# Patient Record
Sex: Female | Born: 1985 | Race: White | Hispanic: No | Marital: Married | Smoking: Never smoker
Health system: Southern US, Community
[De-identification: ages and names within clinical notes are randomized; demographics above are authoritative.]

## PROBLEM LIST (undated history)

## (undated) ENCOUNTER — Inpatient Hospital Stay (HOSPITAL_COMMUNITY): Payer: BC Managed Care – PPO

## (undated) ENCOUNTER — Inpatient Hospital Stay (HOSPITAL_COMMUNITY): Payer: Self-pay

## (undated) DIAGNOSIS — Z789 Other specified health status: Secondary | ICD-10-CM

## (undated) HISTORY — PX: OTHER SURGICAL HISTORY: SHX169

## (undated) HISTORY — PX: WISDOM TOOTH EXTRACTION: SHX21

## (undated) HISTORY — DX: Other specified health status: Z78.9

---

## 2011-10-30 ENCOUNTER — Ambulatory Visit (INDEPENDENT_AMBULATORY_CARE_PROVIDER_SITE_OTHER): Payer: BC Managed Care – PPO

## 2011-10-30 DIAGNOSIS — R300 Dysuria: Secondary | ICD-10-CM

## 2013-04-15 ENCOUNTER — Encounter: Payer: Self-pay | Admitting: Obstetrics

## 2013-06-12 ENCOUNTER — Ambulatory Visit: Payer: BC Managed Care – PPO | Admitting: Obstetrics

## 2013-06-26 ENCOUNTER — Encounter: Payer: Self-pay | Admitting: Obstetrics

## 2013-06-26 ENCOUNTER — Ambulatory Visit (INDEPENDENT_AMBULATORY_CARE_PROVIDER_SITE_OTHER): Payer: BC Managed Care – PPO | Admitting: Obstetrics

## 2013-06-26 VITALS — BP 122/81 | HR 87 | Temp 98.6°F | Ht 63.0 in | Wt 188.2 lb

## 2013-06-26 DIAGNOSIS — Z1239 Encounter for other screening for malignant neoplasm of breast: Secondary | ICD-10-CM

## 2013-06-26 DIAGNOSIS — Z01419 Encounter for gynecological examination (general) (routine) without abnormal findings: Secondary | ICD-10-CM

## 2013-06-26 MED ORDER — PRENATAL PLUS 27-1 MG PO TABS
1.0000 | ORAL_TABLET | Freq: Every day | ORAL | Status: DC
Start: 2013-06-26 — End: 2014-07-14

## 2013-06-26 MED ORDER — PRENATAL PLUS 27-1 MG PO TABS: 1.0000 | ORAL_TABLET | Freq: Every day | ORAL | Status: DC

## 2013-06-26 NOTE — Progress Notes (Signed)
.   Subjective:     Katelyn Marks is a 27 y.o. female here for a routine exam.  No current complaints.  Personal health questionnaire reviewed: yes.   Gynecologic History Patient's last menstrual period was 06/08/2013. Contraception: OCP (estrogen/progesterone) Last Pap: 2012. Results were: normal Last mammogram: N/A  Obstetric History OB History  No data available     The following portions of the patient's history were reviewed and updated as appropriate: allergies, current medications, past family history, past medical history, past social history, past surgical history and problem list.  Review of Systems Pertinent items are noted in HPI.    Objective:    General appearance: alert and no distress Breasts: normal appearance, no masses or tenderness Abdomen: normal findings: soft, non-tender Pelvic: cervix normal in appearance, external genitalia normal, no adnexal masses or tenderness, no cervical motion tenderness, uterus normal size, shape, and consistency and vagina normal without discharge    Assessment:    Healthy female exam.    Plan:    Contraception: OCP (estrogen/progesterone). Follow up in: 1 year.

## 2013-06-27 LAB — PAP IG W/ RFLX HPV ASCU

## 2013-06-27 LAB — WET PREP BY MOLECULAR PROBE: Gardnerella vaginalis: NEGATIVE

## 2014-05-07 ENCOUNTER — Ambulatory Visit (INDEPENDENT_AMBULATORY_CARE_PROVIDER_SITE_OTHER): Payer: BC Managed Care – PPO | Admitting: *Deleted

## 2014-05-07 VITALS — BP 128/81 | HR 98 | Temp 97.7°F | Ht 63.0 in | Wt 193.0 lb

## 2014-05-07 DIAGNOSIS — Z3200 Encounter for pregnancy test, result unknown: Secondary | ICD-10-CM

## 2014-05-07 LAB — POCT URINE PREGNANCY: Preg Test, Ur: POSITIVE

## 2014-05-07 NOTE — Progress Notes (Signed)
Patient in office today for a pregnancy test. Patient states she had two positive home pregnancy tests.  Pregnancy test in office is positive. EDD: 01-06-15. Patient intends on continuing pregnancy.  Patient is currently taking prenatal vitamins and encouraged to continue.  NOB appointment set up for 10 weeks.  Safe OTC meds for nausea and vomiting reviewed.

## 2014-06-10 ENCOUNTER — Other Ambulatory Visit: Payer: Self-pay | Admitting: Obstetrics

## 2014-06-10 ENCOUNTER — Encounter: Payer: Self-pay | Admitting: Obstetrics

## 2014-06-10 ENCOUNTER — Ambulatory Visit (INDEPENDENT_AMBULATORY_CARE_PROVIDER_SITE_OTHER): Payer: BC Managed Care – PPO | Admitting: Obstetrics

## 2014-06-10 VITALS — BP 138/93 | HR 96 | Temp 96.5°F | Wt 188.0 lb

## 2014-06-10 DIAGNOSIS — Z3400 Encounter for supervision of normal first pregnancy, unspecified trimester: Secondary | ICD-10-CM

## 2014-06-10 DIAGNOSIS — Z3401 Encounter for supervision of normal first pregnancy, first trimester: Secondary | ICD-10-CM

## 2014-06-10 LAB — POCT URINALYSIS DIPSTICK
Bilirubin, UA: NEGATIVE
GLUCOSE UA: NEGATIVE
Ketones, UA: NEGATIVE
LEUKOCYTES UA: NEGATIVE
Nitrite, UA: NEGATIVE
PH UA: 7
Protein, UA: NEGATIVE
RBC UA: NEGATIVE
Spec Grav, UA: <=1.005
UROBILINOGEN UA: NEGATIVE

## 2014-06-10 LAB — OB RESULTS CONSOLE GC/CHLAMYDIA
Chlamydia: NEGATIVE
Gonorrhea: NEGATIVE

## 2014-06-11 ENCOUNTER — Encounter: Payer: Self-pay | Admitting: Obstetrics

## 2014-06-11 LAB — OBSTETRIC PANEL
ANTIBODY SCREEN: NEGATIVE
Basophils Absolute: 0 10*3/uL (ref 0.0–0.1)
Basophils Relative: 0 % (ref 0–1)
EOS PCT: 1 % (ref 0–5)
Eosinophils Absolute: 0.1 10*3/uL (ref 0.0–0.7)
HEMATOCRIT: 44.8 % (ref 36.0–46.0)
Hemoglobin: 15.1 g/dL — ABNORMAL HIGH (ref 12.0–15.0)
Hepatitis B Surface Ag: NEGATIVE
LYMPHS ABS: 2 10*3/uL (ref 0.7–4.0)
LYMPHS PCT: 19 % (ref 12–46)
MCH: 28.9 pg (ref 26.0–34.0)
MCHC: 33.7 g/dL (ref 30.0–36.0)
MCV: 85.8 fL (ref 78.0–100.0)
MONO ABS: 0.6 10*3/uL (ref 0.1–1.0)
MONOS PCT: 6 % (ref 3–12)
Neutro Abs: 7.7 10*3/uL (ref 1.7–7.7)
Neutrophils Relative %: 74 % (ref 43–77)
Platelets: 338 10*3/uL (ref 150–400)
RBC: 5.22 MIL/uL — AB (ref 3.87–5.11)
RDW: 13.9 % (ref 11.5–15.5)
RH TYPE: POSITIVE
RUBELLA: 3.82 {index} — AB (ref <0.90)
WBC: 10.4 10*3/uL (ref 4.0–10.5)

## 2014-06-11 LAB — VARICELLA ZOSTER ANTIBODY, IGG: Varicella IgG: 1971 Index — ABNORMAL HIGH (ref <135.00)

## 2014-06-11 LAB — CULTURE, OB URINE

## 2014-06-11 LAB — GC/CHLAMYDIA PROBE AMP
CT Probe RNA: NEGATIVE
GC PROBE AMP APTIMA: NEGATIVE

## 2014-06-11 LAB — WET PREP BY MOLECULAR PROBE
Candida species: NEGATIVE
Gardnerella vaginalis: NEGATIVE
TRICHOMONAS VAG: NEGATIVE

## 2014-06-11 LAB — TSH: TSH: 0.626 u[IU]/mL (ref 0.350–4.500)

## 2014-06-11 LAB — VITAMIN D 25 HYDROXY (VIT D DEFICIENCY, FRACTURES): VIT D 25 HYDROXY: 59 ng/mL (ref 30–89)

## 2014-06-11 LAB — HIV ANTIBODY (ROUTINE TESTING W REFLEX): HIV: NONREACTIVE

## 2014-06-11 NOTE — Progress Notes (Signed)
  Subjective:    Katelyn Marks is a 28 y.o. female being seen today for her obstetrical visit. She is at [redacted]w[redacted]d gestation. Patient reports: no complaints.  Problem List Items Addressed This Visit   None    Visit Diagnoses   Encounter for supervision of normal first pregnancy in first trimester    -  Primary    Relevant Orders       Obstetric panel (Completed)       HIV antibody (Completed)       Varicella zoster antibody, IgG (Completed)       Vit D  25 hydroxy (rtn osteoporosis monitoring) (Completed)       Culture, OB Urine       GC/Chlamydia Probe Amp (Completed)       WET PREP BY MOLECULAR PROBE       TSH (Completed)      There are no active problems to display for this patient.   Objective:     BP 138/93  Pulse 96  Temp(Src) 96.5 F (35.8 C)  Wt 188 lb (85.276 kg)  LMP 04/01/2014 Uterine Size: Below umbilicus     Assessment:    Pregnancy @ [redacted]w[redacted]d  weeks Doing well    Plan:    Problem list reviewed and updated. Labs reviewed.  Follow up in 4 weeks. FIRST/CF mutation testing/NIPT/QUAD SCREEN/fragile X/[name] population testing/Spinal muscular atrophy discussed: requested. Role of ultrasound in pregnancy discussed; fetal survey: requested. Amniocentesis discussed: not indicated.

## 2014-06-12 NOTE — Addendum Note (Signed)
Addended by: Odessa Fleming on: 06/12/2014 11:07 AM   Modules accepted: Orders

## 2014-07-09 ENCOUNTER — Ambulatory Visit (INDEPENDENT_AMBULATORY_CARE_PROVIDER_SITE_OTHER): Payer: BC Managed Care – PPO | Admitting: Obstetrics

## 2014-07-09 ENCOUNTER — Encounter: Payer: Self-pay | Admitting: Obstetrics

## 2014-07-09 VITALS — BP 129/84 | HR 82 | Temp 98.2°F | Wt 183.0 lb

## 2014-07-09 DIAGNOSIS — Z3402 Encounter for supervision of normal first pregnancy, second trimester: Secondary | ICD-10-CM

## 2014-07-09 DIAGNOSIS — O219 Vomiting of pregnancy, unspecified: Secondary | ICD-10-CM

## 2014-07-09 LAB — POCT URINALYSIS DIPSTICK
Bilirubin, UA: NEGATIVE
Blood, UA: NEGATIVE
Glucose, UA: NEGATIVE
Ketones, UA: NEGATIVE
Leukocytes, UA: NEGATIVE
NITRITE UA: NEGATIVE
PH UA: 6
PROTEIN UA: NEGATIVE
UROBILINOGEN UA: NEGATIVE

## 2014-07-09 MED ORDER — DOXYLAMINE-PYRIDOXINE 10-10 MG PO TBEC: 1.0000 | DELAYED_RELEASE_TABLET | Freq: Every day | ORAL | Status: DC

## 2014-07-09 NOTE — Progress Notes (Signed)
  Subjective:    Katelyn Marks is a 28 y.o. female being seen today for her obstetrical visit. She is at 4544w1d gestation. Patient reports: nausea and vomiting.  Problem List Items Addressed This Visit   Nausea and vomiting during pregnancy prior to [redacted] weeks gestation   Relevant Medications      Doxylamine-Pyridoxine 10-10 MG TBEC    Other Visit Diagnoses   Encounter for supervision of normal first pregnancy in second trimester    -  Primary    Relevant Orders       POCT urinalysis dipstick (Completed)      Patient Active Problem List   Diagnosis Date Noted  . Nausea and vomiting during pregnancy prior to [redacted] weeks gestation 07/09/2014    Objective:     BP 129/84  Pulse 82  Temp(Src) 98.2 F (36.8 C)  Wt 183 lb (83.008 kg)  LMP 04/01/2014 Uterine Size: Below umbilicus     Assessment:    Pregnancy @ 3244w1d  weeks  Nausea and Vomiting  Plan:   Stop PNV's Diclegis Rx Dietary instructions given for prevention of nausea.   Problem list reviewed and updated. Labs reviewed.  Follow up in 2 weeks. FIRST/CF mutation testing/NIPT/QUAD SCREEN/fragile X/[name] population testing/Spinal muscular atrophy discussed: requested. Role of ultrasound in pregnancy discussed; fetal survey: requested. Amniocentesis discussed: not indicated.

## 2014-07-14 ENCOUNTER — Other Ambulatory Visit: Payer: Self-pay | Admitting: Obstetrics

## 2014-07-27 ENCOUNTER — Encounter: Payer: Self-pay | Admitting: Obstetrics

## 2014-07-27 ENCOUNTER — Ambulatory Visit (INDEPENDENT_AMBULATORY_CARE_PROVIDER_SITE_OTHER): Payer: BC Managed Care – PPO | Admitting: Obstetrics

## 2014-07-27 VITALS — BP 127/82 | HR 82 | Wt 184.0 lb

## 2014-07-27 DIAGNOSIS — Z3402 Encounter for supervision of normal first pregnancy, second trimester: Secondary | ICD-10-CM

## 2014-07-27 LAB — POCT URINALYSIS DIPSTICK
Bilirubin, UA: NEGATIVE
Glucose, UA: NEGATIVE
KETONES UA: NEGATIVE
Leukocytes, UA: NEGATIVE
Nitrite, UA: NEGATIVE
PH UA: 6
PROTEIN UA: NEGATIVE
RBC UA: NEGATIVE
SPEC GRAV UA: 1.01
Urobilinogen, UA: NEGATIVE

## 2014-07-27 NOTE — Progress Notes (Signed)
  Subjective:    Katelyn Marks is a 28 y.o. female being seen today for her obstetrical visit. She is at 29103w5d gestation. Patient reports: no complaints.  Problem List Items Addressed This Visit   None    Visit Diagnoses   Encounter for supervision of normal first pregnancy in second trimester    -  Primary    Relevant Orders       POCT urinalysis dipstick (Completed)       US OB Comp + 14 Wk       AFP, Quad Screen      Patient Active Problem List   Diagnosis Date Noted  . Nausea and vomiting during pregnancy prior to [redacted] weeks gestation 07/09/2014    Objective:     BP 127/82  Pulse 82  Wt 184 lb (83.462 kg)  LMP 04/01/2014 Uterine Size: Below umbilicus     Assessment:    Pregnancy @ 52103w5d  weeks Doing well    Plan:    Problem list reviewed and updated. Labs reviewed.  Follow up in 4 weeks. FIRST/CF mutation testing/NIPT/QUAD SCREEN/fragile X/[name] population testing/Spinal muscular atrophy discussed: requested. Role of ultrasound in pregnancy discussed; fetal survey: requested. Amniocentesis discussed: not indicated.

## 2014-07-28 ENCOUNTER — Telehealth: Payer: Self-pay

## 2014-07-28 LAB — AFP, QUAD SCREEN
AFP: 18.3 ng/mL
Age Alone: 1:970 {titer}
Curr Gest Age: 16.5 wks.days
Down Syndrome Scr Risk Est: 1:285 {titer}
HCG, Total: 44.71 [IU]/mL
INH: 251.6 pg/mL
Interpretation-AFP: NEGATIVE
MoM for AFP: 0.57
MoM for INH: 1.77
MoM for hCG: 1.49
Open Spina bifida: NEGATIVE
Osb Risk: <1:27300 {titer}
Tri 18 Scr Risk Est: NEGATIVE
Trisomy 18 (Edward) Syndrome Interp.: <1:25400 {titer}
uE3 Mom: 1.85
uE3 Value: 1.66 ng/mL

## 2014-07-28 NOTE — Telephone Encounter (Signed)
left message regarding appt with [facility] for u/s on 10/22, 3:30pm

## 2014-07-28 NOTE — Telephone Encounter (Signed)
FMLA paperwork faxed - 15.00 pmt when she comes for next appt.

## 2014-07-30 ENCOUNTER — Ambulatory Visit (HOSPITAL_COMMUNITY): Payer: BC Managed Care – PPO

## 2014-08-03 ENCOUNTER — Ambulatory Visit (HOSPITAL_COMMUNITY)
Admission: RE | Admit: 2014-08-03 | Discharge: 2014-08-03 | Disposition: A | Discharge status: Home / Self Care | Payer: BC Managed Care – PPO | Source: Ambulatory Visit | Attending: Obstetrics | Admitting: Obstetrics

## 2014-08-03 DIAGNOSIS — Z3689 Encounter for other specified antenatal screening: Secondary | ICD-10-CM

## 2014-08-03 DIAGNOSIS — Z3A17 17 weeks gestation of pregnancy: Secondary | ICD-10-CM | POA: Insufficient documentation

## 2014-08-03 DIAGNOSIS — Z36 Encounter for antenatal screening of mother: Secondary | ICD-10-CM | POA: Diagnosis present

## 2014-08-03 DIAGNOSIS — Z3402 Encounter for supervision of normal first pregnancy, second trimester: Secondary | ICD-10-CM

## 2014-08-10 ENCOUNTER — Encounter: Payer: Self-pay | Admitting: Obstetrics

## 2014-08-26 ENCOUNTER — Encounter: Payer: BC Managed Care – PPO | Admitting: Obstetrics

## 2014-08-28 ENCOUNTER — Ambulatory Visit (INDEPENDENT_AMBULATORY_CARE_PROVIDER_SITE_OTHER): Payer: BC Managed Care – PPO | Admitting: Obstetrics

## 2014-08-28 ENCOUNTER — Encounter: Payer: Self-pay | Admitting: Obstetrics

## 2014-08-28 VITALS — BP 121/80 | HR 100 | Temp 97.1°F | Wt 192.0 lb

## 2014-08-28 DIAGNOSIS — Z3402 Encounter for supervision of normal first pregnancy, second trimester: Secondary | ICD-10-CM

## 2014-08-28 LAB — POCT URINALYSIS DIPSTICK
Bilirubin, UA: NEGATIVE
Blood, UA: NEGATIVE
Glucose, UA: NEGATIVE
Ketones, UA: NEGATIVE
LEUKOCYTES UA: NEGATIVE
NITRITE UA: NEGATIVE
PROTEIN UA: NEGATIVE
Spec Grav, UA: 1.01
UROBILINOGEN UA: NEGATIVE
pH, UA: 6.5

## 2014-08-28 MED ORDER — PREPLUS 27-1 MG PO TABS: ORAL_TABLET | ORAL | Status: DC

## 2014-08-28 NOTE — Progress Notes (Signed)
Subjective:    Katelyn Marks is a 28 y.o. female being seen today for her obstetrical visit. She is at 428w2d gestation. Patient reports: no complaints . Fetal movement: normal.  Problem List Items Addressed This Visit    None    Visit Diagnoses    Encounter for supervision of normal first pregnancy in second trimester    -  Primary    Relevant Orders       POCT urinalysis dipstick (Completed)      Patient Active Problem List   Diagnosis Date Noted  . Nausea and vomiting during pregnancy prior to [redacted] weeks gestation 07/09/2014   Objective:    BP 121/80 mmHg  Pulse 100  Temp(Src) 97.1 F (36.2 C)  Wt 192 lb (87.091 kg)  LMP 04/01/2014 FHT: 150 BPM  Uterine Size: size equals dates     Assessment:    Pregnancy @ 3028w2d    Plan:    OBGCT: ordered for next visit.  Labs, problem list reviewed and updated 2 hr GTT planned Follow up in 4 weeks.

## 2014-09-29 ENCOUNTER — Ambulatory Visit (INDEPENDENT_AMBULATORY_CARE_PROVIDER_SITE_OTHER): Payer: BC Managed Care – PPO | Admitting: Obstetrics

## 2014-09-29 ENCOUNTER — Encounter: Payer: Self-pay | Admitting: Obstetrics

## 2014-09-29 ENCOUNTER — Other Ambulatory Visit: Payer: BC Managed Care – PPO

## 2014-09-29 VITALS — BP 119/82 | HR 102 | Temp 97.7°F | Wt 190.0 lb

## 2014-09-29 DIAGNOSIS — Z3402 Encounter for supervision of normal first pregnancy, second trimester: Secondary | ICD-10-CM

## 2014-09-29 LAB — POCT URINALYSIS DIPSTICK
Bilirubin, UA: NEGATIVE
Blood, UA: NEGATIVE
Glucose, UA: NEGATIVE
Leukocytes, UA: NEGATIVE
NITRITE UA: NEGATIVE
PH UA: 6
PROTEIN UA: NEGATIVE
Spec Grav, UA: 1.015
Urobilinogen, UA: NEGATIVE

## 2014-09-29 NOTE — Progress Notes (Signed)
Subjective:    Katelyn Marks is a 28 y.o. female being seen today for her obstetrical visit. She is at 3254w6d gestation. Patient reports: no complaints . Fetal movement: normal.  Problem List Items Addressed This Visit    None    Visit Diagnoses    Encounter for supervision of normal first pregnancy in second trimester    -  Primary    Relevant Orders       POCT urinalysis dipstick (Completed)      Patient Active Problem List   Diagnosis Date Noted  . Nausea and vomiting during pregnancy prior to [redacted] weeks gestation 07/09/2014   Objective:    BP 119/82 mmHg  Pulse 102  Temp(Src) 97.7 F (36.5 C)  Wt 190 lb (86.183 kg)  LMP 04/01/2014 FHT: 150 BPM  Uterine Size: size equals dates     Assessment:    Pregnancy @ 1054w6d    Plan:    OBGCT: discussed.  Labs, problem list reviewed and updated 2 hr GTT planned Follow up in 2 weeks.

## 2014-09-29 NOTE — Addendum Note (Signed)
Addended by: Odessa FlemingBOHNE, Denissa Cozart M on: 09/29/2014 02:04 PM   Modules accepted: Orders

## 2014-09-30 LAB — GLUCOSE TOLERANCE, 2 HOURS W/ 1HR
Glucose, 1 hour: 150 mg/dL (ref 70–170)
Glucose, 2 hour: 140 mg/dL — ABNORMAL HIGH (ref 70–139)
Glucose, Fasting: 72 mg/dL (ref 70–99)

## 2014-09-30 LAB — CBC
HEMATOCRIT: 37.5 % (ref 36.0–46.0)
Hemoglobin: 12.4 g/dL (ref 12.0–15.0)
MCH: 29.4 pg (ref 26.0–34.0)
MCHC: 33.1 g/dL (ref 30.0–36.0)
MCV: 88.9 fL (ref 78.0–100.0)
MPV: 9.7 fL (ref 9.4–12.4)
Platelets: 243 10*3/uL (ref 150–400)
RBC: 4.22 MIL/uL (ref 3.87–5.11)
RDW: 14.2 % (ref 11.5–15.5)
WBC: 13.2 10*3/uL — ABNORMAL HIGH (ref 4.0–10.5)

## 2014-09-30 LAB — RPR

## 2014-09-30 LAB — HIV ANTIBODY (ROUTINE TESTING W REFLEX): HIV 1&2 Ab, 4th Generation: NONREACTIVE

## 2014-10-13 ENCOUNTER — Ambulatory Visit (INDEPENDENT_AMBULATORY_CARE_PROVIDER_SITE_OTHER): Payer: BC Managed Care – PPO | Admitting: Obstetrics

## 2014-10-13 VITALS — BP 116/78 | HR 99 | Temp 98.2°F | Wt 191.0 lb

## 2014-10-13 DIAGNOSIS — Z3403 Encounter for supervision of normal first pregnancy, third trimester: Secondary | ICD-10-CM

## 2014-10-13 LAB — POCT URINALYSIS DIPSTICK
Bilirubin, UA: NEGATIVE
Blood, UA: NEGATIVE
GLUCOSE UA: NEGATIVE
Ketones, UA: NEGATIVE
Leukocytes, UA: NEGATIVE
Nitrite, UA: NEGATIVE
Protein, UA: NEGATIVE
Spec Grav, UA: 1.015
Urobilinogen, UA: NEGATIVE
pH, UA: 6.5

## 2014-10-14 ENCOUNTER — Encounter: Payer: Self-pay | Admitting: Obstetrics

## 2014-10-14 NOTE — Progress Notes (Signed)
Subjective:    Katelyn Marks is a 29 y.o. female being seen today for her obstetrical visit. She is at 5266w0d gestation. Patient reports no complaints. Fetal movement: normal.  Problem List Items Addressed This Visit    None    Visit Diagnoses    Encounter for supervision of normal first pregnancy in second trimester    -  Primary    Relevant Orders       POCT urinalysis dipstick (Completed)      Patient Active Problem List   Diagnosis Date Noted  . Nausea and vomiting during pregnancy prior to [redacted] weeks gestation 07/09/2014   Objective:    BP 116/78 mmHg  Pulse 99  Temp(Src) 98.2 F (36.8 C)  Wt 191 lb (86.637 kg)  LMP 04/01/2014 FHT:  150 BPM  Uterine Size: size equals dates  Presentation: unsure     Assessment:    Pregnancy @ 6266w0d weeks   Plan:     labs reviewed, problem list updated Consent signed. GBS sent TDAP offered  Rhogam given for RH negative Pediatrician: discussed. Infant feeding: plans to breastfeed. Maternity leave: not discussed. Cigarette smoking: none Orders Placed This Encounter  Procedures  . POCT urinalysis dipstick   No orders of the defined types were placed in this encounter.   Follow up in 2 Weeks.

## 2014-10-27 ENCOUNTER — Ambulatory Visit (INDEPENDENT_AMBULATORY_CARE_PROVIDER_SITE_OTHER): Payer: BC Managed Care – PPO | Admitting: Obstetrics

## 2014-10-27 VITALS — BP 122/83 | HR 104 | Temp 98.5°F | Wt 195.0 lb

## 2014-10-27 DIAGNOSIS — Z3403 Encounter for supervision of normal first pregnancy, third trimester: Secondary | ICD-10-CM

## 2014-10-27 LAB — POCT URINALYSIS DIPSTICK
Bilirubin, UA: NEGATIVE
Blood, UA: NEGATIVE
Glucose, UA: NEGATIVE
KETONES UA: NEGATIVE
NITRITE UA: NEGATIVE
Spec Grav, UA: <=1.005
UROBILINOGEN UA: NEGATIVE
pH, UA: 7.5

## 2014-10-28 ENCOUNTER — Encounter: Payer: Self-pay | Admitting: Obstetrics

## 2014-10-28 NOTE — Progress Notes (Signed)
Subjective:    Katelyn BeckmannKimberly A Marks is a 29 y.o. female being seen today for her obstetrical visit. She is at 2954w0d gestation. Patient reports no complaints. Fetal movement: normal.  Problem List Items Addressed This Visit    None    Visit Diagnoses    Encounter for supervision of normal first pregnancy in third trimester    -  Primary    Relevant Orders    POCT urinalysis dipstick (Completed)      Patient Active Problem List   Diagnosis Date Noted  . Nausea and vomiting during pregnancy prior to [redacted] weeks gestation 07/09/2014   Objective:    BP 122/83 mmHg  Pulse 104  Temp(Src) 98.5 F (36.9 C)  Wt 195 lb (88.451 kg)  LMP 04/01/2014 FHT:  150 BPM  Uterine Size: size equals dates  Presentation: unsure     Assessment:    Pregnancy @ 5454w0d weeks   Plan:     labs reviewed, problem list updated Consent signed. GBS sent TDAP offered  Rhogam given for RH negative Pediatrician: discussed. Infant feeding: plans to breastfeed. Maternity leave: not discussed. Cigarette smoking: never smoked. Orders Placed This Encounter  Procedures  . POCT urinalysis dipstick   No orders of the defined types were placed in this encounter.   Follow up in 2 Weeks.

## 2014-11-11 ENCOUNTER — Ambulatory Visit (INDEPENDENT_AMBULATORY_CARE_PROVIDER_SITE_OTHER): Payer: BC Managed Care – PPO | Admitting: Obstetrics

## 2014-11-11 VITALS — BP 128/83 | HR 92 | Temp 98.1°F | Wt 195.0 lb

## 2014-11-11 DIAGNOSIS — Z3403 Encounter for supervision of normal first pregnancy, third trimester: Secondary | ICD-10-CM

## 2014-11-11 LAB — POCT URINALYSIS DIPSTICK
Bilirubin, UA: NEGATIVE
Blood, UA: NEGATIVE
Glucose, UA: NEGATIVE
Ketones, UA: NEGATIVE
LEUKOCYTES UA: NEGATIVE
NITRITE UA: NEGATIVE
PH UA: 6
PROTEIN UA: NEGATIVE
Spec Grav, UA: 1.01
Urobilinogen, UA: NEGATIVE

## 2014-11-12 ENCOUNTER — Encounter: Payer: Self-pay | Admitting: Obstetrics

## 2014-11-12 NOTE — Progress Notes (Signed)
Subjective:    Katelyn Marks is a 29 y.o. female being seen today for her obstetrical visit. She is at 2865w1d gestation. Patient reports no complaints. Fetal movement: normal.  Problem List Items Addressed This Visit    None    Visit Diagnoses    Encounter for supervision of normal first pregnancy in third trimester    -  Primary    Relevant Orders    POCT urinalysis dipstick (Completed)      Patient Active Problem List   Diagnosis Date Noted  . Nausea and vomiting during pregnancy prior to [redacted] weeks gestation 07/09/2014   Objective:    BP 128/83 mmHg  Pulse 92  Temp(Src) 98.1 F (36.7 C)  Wt 195 lb (88.451 kg)  LMP 04/01/2014 FHT:  150 BPM  Uterine Size: size equals dates  Presentation: unsure     Assessment:    Pregnancy @ 9765w1d weeks   Plan:     labs reviewed, problem list updated Consent signed. GBS sent TDAP offered  Rhogam given for RH negative Pediatrician: discussed. Infant feeding: plans to breastfeed. Maternity leave: discussed. Cigarette smoking: never smoked. Orders Placed This Encounter  Procedures  . POCT urinalysis dipstick   No orders of the defined types were placed in this encounter.   Follow up in 2 Weeks.

## 2014-11-20 ENCOUNTER — Telehealth: Payer: Self-pay | Admitting: Obstetrics

## 2014-11-20 ENCOUNTER — Inpatient Hospital Stay (HOSPITAL_COMMUNITY)
Admission: AD | Admit: 2014-11-20 | Discharge: 2014-11-20 | Disposition: A | Discharge status: Home / Self Care | Payer: BC Managed Care – PPO | Source: Ambulatory Visit | Attending: Obstetrics | Admitting: Obstetrics

## 2014-11-20 ENCOUNTER — Encounter (HOSPITAL_COMMUNITY): Payer: Self-pay | Admitting: *Deleted

## 2014-11-20 DIAGNOSIS — O99613 Diseases of the digestive system complicating pregnancy, third trimester: Secondary | ICD-10-CM | POA: Diagnosis not present

## 2014-11-20 DIAGNOSIS — R12 Heartburn: Secondary | ICD-10-CM | POA: Diagnosis not present

## 2014-11-20 DIAGNOSIS — R1013 Epigastric pain: Secondary | ICD-10-CM | POA: Insufficient documentation

## 2014-11-20 DIAGNOSIS — Z3A33 33 weeks gestation of pregnancy: Secondary | ICD-10-CM | POA: Diagnosis not present

## 2014-11-20 DIAGNOSIS — K219 Gastro-esophageal reflux disease without esophagitis: Secondary | ICD-10-CM | POA: Diagnosis not present

## 2014-11-20 DIAGNOSIS — R109 Unspecified abdominal pain: Secondary | ICD-10-CM | POA: Diagnosis present

## 2014-11-20 DIAGNOSIS — O9989 Other specified diseases and conditions complicating pregnancy, childbirth and the puerperium: Secondary | ICD-10-CM | POA: Diagnosis not present

## 2014-11-20 LAB — COMPREHENSIVE METABOLIC PANEL
ALBUMIN: 2.9 g/dL — AB (ref 3.5–5.2)
ALK PHOS: 105 U/L (ref 39–117)
ALT: 13 U/L (ref 0–35)
AST: 16 U/L (ref 0–37)
Anion gap: 8 (ref 5–15)
BUN: 6 mg/dL (ref 6–23)
CO2: 21 mmol/L (ref 19–32)
Calcium: 8.7 mg/dL (ref 8.4–10.5)
Chloride: 108 mmol/L (ref 96–112)
Creatinine, Ser: 0.45 mg/dL — ABNORMAL LOW (ref 0.50–1.10)
GFR calc Af Amer: >90 mL/min (ref >90)
GFR calc non Af Amer: >90 mL/min (ref >90)
Glucose, Bld: 96 mg/dL (ref 70–99)
POTASSIUM: 3.9 mmol/L (ref 3.5–5.1)
SODIUM: 137 mmol/L (ref 135–145)
Total Bilirubin: 0.2 mg/dL — ABNORMAL LOW (ref 0.3–1.2)
Total Protein: 6.4 g/dL (ref 6.0–8.3)

## 2014-11-20 LAB — CBC WITH DIFFERENTIAL/PLATELET
Basophils Absolute: 0 10*3/uL (ref 0.0–0.1)
Basophils Relative: 0 % (ref 0–1)
EOS PCT: 1 % (ref 0–5)
Eosinophils Absolute: 0.1 10*3/uL (ref 0.0–0.7)
HCT: 36.5 % (ref 36.0–46.0)
Hemoglobin: 12.1 g/dL (ref 12.0–15.0)
Lymphocytes Relative: 12 % (ref 12–46)
Lymphs Abs: 1.2 10*3/uL (ref 0.7–4.0)
MCH: 29.2 pg (ref 26.0–34.0)
MCHC: 33.2 g/dL (ref 30.0–36.0)
MCV: 88 fL (ref 78.0–100.0)
Monocytes Absolute: 0.7 10*3/uL (ref 0.1–1.0)
Monocytes Relative: 7 % (ref 3–12)
NEUTROS ABS: 8.6 10*3/uL — AB (ref 1.7–7.7)
NEUTROS PCT: 80 % — AB (ref 43–77)
Platelets: 212 10*3/uL (ref 150–400)
RBC: 4.15 MIL/uL (ref 3.87–5.11)
RDW: 14.3 % (ref 11.5–15.5)
WBC: 10.5 10*3/uL (ref 4.0–10.5)

## 2014-11-20 LAB — LIPASE, BLOOD: Lipase: 22 U/L (ref 11–59)

## 2014-11-20 LAB — AMYLASE: Amylase: 40 U/L (ref 0–105)

## 2014-11-20 MED ORDER — FAMOTIDINE 20 MG PO TABS
20.0000 mg | ORAL_TABLET | Freq: Once | ORAL | Status: AC
  Administered 2014-11-20: 20 mg via ORAL
  Filled 2014-11-20: qty 1

## 2014-11-20 MED ORDER — FAMOTIDINE 20 MG PO TABS: 20.0000 mg | ORAL_TABLET | Freq: Two times a day (BID) | ORAL | Status: DC

## 2014-11-20 NOTE — MAU Note (Signed)
Started having pain in upper abd last night.  Last a couple hours. Felt like menstrual cramps.  Started again this morning. Also having nausea and vomiting. No diarrhea or fever.

## 2014-11-20 NOTE — Telephone Encounter (Signed)
Pt called to set up an appt with Dr. Clearance CootsHarper. Pt stated a lot of discomfort and pain on her upper abdominal area. [facility]  Pt understood recommendation.   [name]

## 2014-11-20 NOTE — MAU Note (Signed)
Urine in lab 

## 2014-11-20 NOTE — MAU Provider Note (Signed)
History     CSN: [id number]  Arrival date and time: 11/20/14 1008   First Provider Initiated Contact with Patient 11/20/14 1050      Chief Complaint  Patient presents with  . Abdominal Cramping  . Emesis   HPI  [name] is a 29 y.o. G1P0 at [redacted]w[redacted]d who presents to MAU today with complaint of epigastric pain since last night. She states an intermittent cramping pain "just below the ribcage." She has had one episode of N/V today. She denies pain now. She does have heartburn. She denies vaginal bleeding, discharge, LOF or contractions. She reports good fetal movement.   OB History    Gravida Para Term Preterm AB TAB SAB Ectopic Multiple Living   1         0      Past Medical History  Diagnosis Date  . Medical history non-contributory     Past Surgical History  Procedure Laterality Date  . Wisdome to    . Wisdom tooth extraction      Family History  Problem Relation Age of Onset  . Cancer Maternal Grandmother     ovarian  . Diabetes Maternal Grandmother   . Diabetes Paternal Grandfather     History  Substance Use Topics  . Smoking status: Never Smoker   . Smokeless tobacco: Never Used  . Alcohol Use: No    Allergies:  Allergies  Allergen Reactions  . Ciprofloxacin Nausea And Vomiting    No prescriptions prior to admission    Review of Systems  Constitutional: Negative for fever and malaise/fatigue.  Gastrointestinal: Positive for heartburn, nausea, vomiting and abdominal pain. Negative for diarrhea and constipation.  Genitourinary: Negative for dysuria, urgency and frequency.       Neg - vaginal bleeding, discharge, LOF   Physical Exam   Blood pressure 125/81, pulse 102, temperature 97.2 F (36.2 C), temperature source Oral, resp. rate 18, weight 197 lb (89.359 kg), last menstrual period 04/01/2014.  Physical Exam  Constitutional: She appears well-developed and well-nourished. No distress.  HENT:  Head: Normocephalic.  Cardiovascular:  Normal rate.   Respiratory: Effort normal.  GI: Soft. She exhibits no distension and no mass. There is no tenderness. There is no rebound and no guarding.  Neurological: She is alert.  Skin: Skin is warm and dry. No erythema.  Psychiatric: She has a normal mood and affect.   Results for orders placed or performed during the hospital encounter of 11/20/14 (from the past 24 hour(s))  CBC with Differential/Platelet     Status: Abnormal   Collection Time: 11/20/14 11:10 AM  Result Value Ref Range   WBC 10.5 4.0 - 10.5 K/uL   RBC 4.15 3.87 - 5.11 MIL/uL   Hemoglobin 12.1 12.0 - 15.0 g/dL   HCT 78.2 95.6 - 21.3 %   MCV 88.0 78.0 - 100.0 fL   MCH 29.2 26.0 - 34.0 pg   MCHC 33.2 30.0 - 36.0 g/dL   RDW 08.6 57.8 - 46.9 %   Platelets 212 150 - 400 K/uL   Neutrophils Relative % 80 (H) 43 - 77 %   Neutro Abs 8.6 (H) 1.7 - 7.7 K/uL   Lymphocytes Relative 12 12 - 46 %   Lymphs Abs 1.2 0.7 - 4.0 K/uL   Monocytes Relative 7 3 - 12 %   Monocytes Absolute 0.7 0.1 - 1.0 K/uL   Eosinophils Relative 1 0 - 5 %   Eosinophils Absolute 0.1 0.0 - 0.7 K/uL  Basophils Relative 0 0 - 1 %   Basophils Absolute 0.0 0.0 - 0.1 K/uL  Comprehensive metabolic panel     Status: Abnormal   Collection Time: 11/20/14 11:10 AM  Result Value Ref Range   Sodium 137 135 - 145 mmol/L   Potassium 3.9 3.5 - 5.1 mmol/L   Chloride 108 96 - 112 mmol/L   CO2 21 19 - 32 mmol/L   Glucose, Bld 96 70 - 99 mg/dL   BUN 6 6 - 23 mg/dL   Creatinine, Ser 6.760.45 (L) 0.50 - 1.10 mg/dL   Calcium 8.7 8.4 - 19.510.5 mg/dL   Total Protein 6.4 6.0 - 8.3 g/dL   Albumin 2.9 (L) 3.5 - 5.2 g/dL   AST 16 0 - 37 U/L   ALT 13 0 - 35 U/L   Alkaline Phosphatase 105 39 - 117 U/L   Total Bilirubin 0.2 (L) 0.3 - 1.2 mg/dL   GFR calc non Af Amer >90 >90 mL/min   GFR calc Af Amer >90 >90 mL/min   Anion gap 8 5 - 15    Fetal Monitoring: Baseline: 140 bpm, moderate variability, + accelerations, no decelerations Contractions: none  MAU Course   Procedures None  MDM CBC, CMP, amylase and lipase today Pepcid given in MAU. Patient reports improvement in symptoms and denies pain while in MAU.  Amylase and Lipase pending at time of discharge. Since CBC and CMP were normal will discharge patient and call if further work-up is required.  Assessment and Plan  A: SIUP at 6550w2d Heartburn in pregnancy  P: Discharge home Rx for Pepcid given to patient Patient advised to follow-up with Dr. Clearance CootsHarper as scheduled for routine prenatal care Patient may return to MAU as needed or if her condition were to change or worsen   Marny LowensteinJulie N Wenzel, PA-C  11/20/2014, 12:31 PM

## 2014-11-20 NOTE — Discharge Instructions (Signed)

## 2014-11-25 ENCOUNTER — Ambulatory Visit (INDEPENDENT_AMBULATORY_CARE_PROVIDER_SITE_OTHER): Payer: BC Managed Care – PPO | Admitting: Obstetrics

## 2014-11-25 VITALS — BP 122/79 | HR 121 | Temp 97.0°F | Wt 199.0 lb

## 2014-11-25 DIAGNOSIS — Z3403 Encounter for supervision of normal first pregnancy, third trimester: Secondary | ICD-10-CM

## 2014-11-25 LAB — POCT URINALYSIS DIPSTICK
BILIRUBIN UA: NEGATIVE
Blood, UA: NEGATIVE
GLUCOSE UA: 50
LEUKOCYTES UA: NEGATIVE
Nitrite, UA: NEGATIVE
PH UA: 6
Protein, UA: NEGATIVE
Spec Grav, UA: 1.02
Urobilinogen, UA: NEGATIVE

## 2014-11-26 ENCOUNTER — Encounter: Payer: Self-pay | Admitting: Obstetrics

## 2014-11-26 NOTE — Progress Notes (Signed)
Subjective:    Katelyn BeckmannKimberly A Sacco is a 29 y.o. female being seen today for her obstetrical visit. She is at 9047w1d gestation. Patient reports no complaints. Fetal movement: normal.  Problem List Items Addressed This Visit    None    Visit Diagnoses    Encounter for supervision of normal first pregnancy in third trimester    -  Primary    Relevant Orders    POCT urinalysis dipstick (Completed)      Patient Active Problem List   Diagnosis Date Noted  . Nausea and vomiting during pregnancy prior to [redacted] weeks gestation 07/09/2014   Objective:    BP 122/79 mmHg  Pulse 121  Temp(Src) 97 F (36.1 C)  Wt 199 lb (90.266 kg)  LMP 04/01/2014 FHT:  150 BPM  Uterine Size: size equals dates  Presentation: unsure     Assessment:    Pregnancy @ 7847w1d weeks   Plan:     labs reviewed, problem list updated Consent signed. GBS sent TDAP offered  Rhogam given for RH negative Pediatrician: discussed. Infant feeding: plans to breastfeed. Maternity leave: discussed. Cigarette smoking: never smoked. Orders Placed This Encounter  Procedures  . POCT urinalysis dipstick   No orders of the defined types were placed in this encounter.   Follow up in 2 Weeks.

## 2014-12-09 ENCOUNTER — Ambulatory Visit (INDEPENDENT_AMBULATORY_CARE_PROVIDER_SITE_OTHER): Payer: BC Managed Care – PPO | Admitting: Certified Nurse Midwife

## 2014-12-09 VITALS — BP 116/80 | HR 90 | Temp 97.9°F | Wt 201.0 lb

## 2014-12-09 DIAGNOSIS — Z3403 Encounter for supervision of normal first pregnancy, third trimester: Secondary | ICD-10-CM

## 2014-12-09 DIAGNOSIS — O3663X1 Maternal care for excessive fetal growth, third trimester, fetus 1: Secondary | ICD-10-CM

## 2014-12-09 LAB — POCT URINALYSIS DIPSTICK
Bilirubin, UA: NEGATIVE
GLUCOSE UA: NEGATIVE
Leukocytes, UA: NEGATIVE
NITRITE UA: NEGATIVE
PH UA: 7
PROTEIN UA: NEGATIVE
RBC UA: NEGATIVE
Urobilinogen, UA: NEGATIVE

## 2014-12-10 NOTE — Progress Notes (Signed)
Subjective:    Katelyn Marks is a 29 y.o. female being seen today for her obstetrical visit. She is at 7441w1d gestation. Patient reports occasional back pain, no contractions, no cramping and no leaking. Fetal movement: normal.  Problem List Items Addressed This Visit    None    Visit Diagnoses    Encounter for supervision of normal first pregnancy in third trimester    -  Primary    Relevant Orders    POCT urinalysis dipstick (Completed)    Strep B DNA probe    LGA (large for gestational age) fetus affecting management of mother, third trimester, fetus 1        Relevant Orders    US OB Follow Up      Patient Active Problem List   Diagnosis Date Noted  . Nausea and vomiting during pregnancy prior to [redacted] weeks gestation 07/09/2014   Objective:    BP 116/80 mmHg  Pulse 90  Temp(Src) 97.9 F (36.6 C)  Wt 91.173 kg (201 lb)  LMP 04/01/2014 FHT:  140 BPM  Uterine Size: size greater than dates  Presentation: cephalic     Assessment:    Pregnancy @ 7441w1d weeks   Large for Gestational Age  Plan:     labs reviewed, Consent signed. GBS sent Pediatrician: discussed. Infant feeding: plans to breastfeed. Maternity leave: discussed. Cigarette smoking: never smoked. Orders Placed This Encounter  Procedures  . Strep B DNA probe  . US OB Follow Up    Standing Status: Future     Number of Occurrences:      Standing Expiration Date: 02/08/2016    Order Specific Question:  Reason for Exam (SYMPTOM  OR DIAGNOSIS REQUIRED)    Answer:  LGA    Order Specific Question:  Preferred imaging location?    Answer:  Internal  . POCT urinalysis dipstick   No orders of the defined types were placed in this encounter.  Rx for Maternity abdominal support belt.  Follow up in 1 Week.

## 2014-12-11 LAB — STREP B DNA PROBE: STREP GROUP B AG: NOT DETECTED

## 2014-12-16 ENCOUNTER — Ambulatory Visit (INDEPENDENT_AMBULATORY_CARE_PROVIDER_SITE_OTHER): Payer: BC Managed Care – PPO | Admitting: Certified Nurse Midwife

## 2014-12-16 ENCOUNTER — Ambulatory Visit (INDEPENDENT_AMBULATORY_CARE_PROVIDER_SITE_OTHER): Payer: BC Managed Care – PPO

## 2014-12-16 VITALS — BP 125/84 | HR 105 | Temp 98.0°F | Wt 201.0 lb

## 2014-12-16 DIAGNOSIS — Z3403 Encounter for supervision of normal first pregnancy, third trimester: Secondary | ICD-10-CM

## 2014-12-16 DIAGNOSIS — O3663X1 Maternal care for excessive fetal growth, third trimester, fetus 1: Secondary | ICD-10-CM

## 2014-12-16 LAB — POCT URINALYSIS DIPSTICK
BILIRUBIN UA: NEGATIVE
GLUCOSE UA: NEGATIVE
Ketones, UA: NEGATIVE
Leukocytes, UA: NEGATIVE
Nitrite, UA: NEGATIVE
PROTEIN UA: NEGATIVE
RBC UA: NEGATIVE
SPEC GRAV UA: 1.02
Urobilinogen, UA: NEGATIVE
pH, UA: 6

## 2014-12-16 LAB — US OB FOLLOW UP

## 2014-12-16 NOTE — Progress Notes (Signed)
Subjective:    Katelyn Marks is a 29 y.o. female being seen today for her obstetrical visit. She is at 6085w0d gestation. Patient reports no bleeding and no contractions, no leaking of fluid, or any complaints. Fetal movement: normal.  [facility]  Questions about labor & delivery answered.  Spouse present for the exam.  Patient is still working full time as a Runner, broadcasting/film/videoteacher.  Has car seat.    Problem List Items Addressed This Visit    None    Visit Diagnoses    Encounter for supervision of normal first pregnancy in third trimester    -  Primary    Relevant Orders    POCT urinalysis dipstick (Completed)      Patient Active Problem List   Diagnosis Date Noted  . Nausea and vomiting during pregnancy prior to [redacted] weeks gestation 07/09/2014    Objective:    BP 125/84 mmHg  Pulse 105  Temp(Src) 98 F (36.7 C)  Wt 91.173 kg (201 lb)  LMP 04/01/2014 FHT: 120 BPM  Uterine Size: 38 cm  Presentations: cephalic  Pelvic Exam: deferred     Assessment:    Pregnancy @ 6885w0d weeks   Plan:   Plans for delivery: Vaginal anticipated; labs reviewed; problem list updated Counseling: Consent signed. Infant feeding: plans to breastfeed. Cigarette smoking: never smoked. L&D discussion: symptoms of labor, discussed when to call, discussed what number to call, anesthetic/analgesic options reviewed and delivering clinician:  plans no preference. Postpartum supports and preparation: circumcision discussed and contraception plans discussed.  Follow up in 1 Week.

## 2014-12-23 ENCOUNTER — Ambulatory Visit (INDEPENDENT_AMBULATORY_CARE_PROVIDER_SITE_OTHER): Payer: BC Managed Care – PPO | Admitting: Obstetrics

## 2014-12-23 VITALS — BP 127/87 | HR 101 | Temp 98.1°F | Wt 204.0 lb

## 2014-12-23 DIAGNOSIS — Z3403 Encounter for supervision of normal first pregnancy, third trimester: Secondary | ICD-10-CM

## 2014-12-23 LAB — POCT URINALYSIS DIPSTICK
Bilirubin, UA: NEGATIVE
Glucose, UA: NEGATIVE
Ketones, UA: NEGATIVE
Leukocytes, UA: NEGATIVE
Nitrite, UA: NEGATIVE
Protein, UA: NEGATIVE
RBC UA: NEGATIVE
SPEC GRAV UA: 1.015
UROBILINOGEN UA: NEGATIVE
pH, UA: 5

## 2014-12-24 ENCOUNTER — Encounter: Payer: Self-pay | Admitting: Obstetrics

## 2014-12-24 NOTE — Progress Notes (Signed)
Subjective:    Katelyn Marks is a 29 y.o. female being seen today for her obstetrical visit. She is at 669w1d gestation. Patient reports no complaints. Fetal movement: normal.  Problem List Items Addressed This Visit    None    Visit Diagnoses    Encounter for supervision of normal first pregnancy in third trimester    -  Primary    Relevant Orders    POCT urinalysis dipstick (Completed)      Patient Active Problem List   Diagnosis Date Noted  . Nausea and vomiting during pregnancy prior to [redacted] weeks gestation 07/09/2014    Objective:    BP 127/87 mmHg  Pulse 101  Temp(Src) 98.1 F (36.7 C)  Wt 204 lb (92.534 kg)  LMP 04/01/2014 FHT: 150 BPM  Uterine Size: size equals dates  Presentations: unsure  Pelvic Exam: Deferred    Assessment:    Pregnancy @ 579w1d weeks   Plan:   Plans for delivery: Vaginal anticipated; labs reviewed; problem list updated Counseling: Consent signed. Infant feeding: plans to breastfeed. Cigarette smoking: never smoked. L&D discussion: symptoms of labor, discussed when to call, discussed what number to call, anesthetic/analgesic options reviewed and delivering clinician:  plans Physician. Postpartum supports and preparation: circumcision discussed and contraception plans discussed.  Follow up in 1 Week.

## 2014-12-30 ENCOUNTER — Ambulatory Visit (INDEPENDENT_AMBULATORY_CARE_PROVIDER_SITE_OTHER): Payer: BC Managed Care – PPO | Admitting: Obstetrics

## 2014-12-30 VITALS — BP 116/79 | HR 86 | Temp 98.1°F | Wt 204.0 lb

## 2014-12-30 DIAGNOSIS — Z3403 Encounter for supervision of normal first pregnancy, third trimester: Secondary | ICD-10-CM

## 2014-12-30 LAB — POCT URINALYSIS DIPSTICK
BILIRUBIN UA: NEGATIVE
Blood, UA: NEGATIVE
GLUCOSE UA: NEGATIVE
LEUKOCYTES UA: NEGATIVE
NITRITE UA: NEGATIVE
Spec Grav, UA: 1.02
Urobilinogen, UA: NEGATIVE
pH, UA: 6

## 2014-12-31 ENCOUNTER — Encounter: Payer: Self-pay | Admitting: Obstetrics

## 2014-12-31 NOTE — Progress Notes (Signed)
Subjective:    Katelyn Marks is a 29 y.o. female being seen today for her obstetrical visit. She is at 750w1d gestation. Patient reports no complaints. Fetal movement: normal.  Problem List Items Addressed This Visit    None    Visit Diagnoses    Encounter for supervision of normal first pregnancy in third trimester    -  Primary    Relevant Orders    POCT urinalysis dipstick (Completed)      Patient Active Problem List   Diagnosis Date Noted  . Nausea and vomiting during pregnancy prior to [redacted] weeks gestation 07/09/2014    Objective:    BP 116/79 mmHg  Pulse 86  Temp(Src) 98.1 F (36.7 C)  Wt 204 lb (92.534 kg)  LMP 04/01/2014 FHT: 150 BPM  Uterine Size: size equals dates  Presentations: cephalic  Pelvic Exam:              Dilation: 1cm       Effacement: 50%             Station:  -3    Consistency: soft            Position: posterior     Assessment:    Pregnancy @ 6650w1d weeks   Plan:   Plans for delivery: Vaginal anticipated; labs reviewed; problem list updated Counseling: Consent signed. Infant feeding: plans to breastfeed. Cigarette smoking: never smoked. L&D discussion: symptoms of labor, discussed when to call, discussed what number to call, anesthetic/analgesic options reviewed and delivering clinician:  plans Physician. Postpartum supports and preparation: circumcision discussed and contraception plans discussed.  Follow up in 1 Week.

## 2015-01-06 ENCOUNTER — Ambulatory Visit (INDEPENDENT_AMBULATORY_CARE_PROVIDER_SITE_OTHER): Payer: BC Managed Care – PPO | Admitting: Obstetrics

## 2015-01-06 ENCOUNTER — Telehealth (HOSPITAL_COMMUNITY): Payer: Self-pay | Admitting: *Deleted

## 2015-01-06 ENCOUNTER — Encounter (HOSPITAL_COMMUNITY): Payer: Self-pay | Admitting: *Deleted

## 2015-01-06 ENCOUNTER — Encounter: Payer: Self-pay | Admitting: Obstetrics

## 2015-01-06 VITALS — BP 126/85 | HR 100 | Temp 97.2°F | Wt 202.0 lb

## 2015-01-06 DIAGNOSIS — Z3403 Encounter for supervision of normal first pregnancy, third trimester: Secondary | ICD-10-CM

## 2015-01-06 LAB — POCT URINALYSIS DIPSTICK
BILIRUBIN UA: NEGATIVE
Blood, UA: NEGATIVE
Glucose, UA: NEGATIVE
Leukocytes, UA: NEGATIVE
NITRITE UA: NEGATIVE
PROTEIN UA: NEGATIVE
SPEC GRAV UA: 1.015
Urobilinogen, UA: NEGATIVE
pH, UA: 6

## 2015-01-06 NOTE — Telephone Encounter (Signed)
Preadmission screen  

## 2015-01-07 ENCOUNTER — Encounter: Payer: Self-pay | Admitting: Obstetrics

## 2015-01-07 NOTE — Progress Notes (Signed)
Subjective:    Katelyn Marks is a 29 y.o. female being seen today for her obstetrical visit. She is at 4529w1d gestation. Patient reports no complaints. Fetal movement: normal.  Problem List Items Addressed This Visit    None    Visit Diagnoses    Encounter for supervision of normal first pregnancy in third trimester    -  Primary    Relevant Orders    POCT urinalysis dipstick (Completed)      Patient Active Problem List   Diagnosis Date Noted  . Nausea and vomiting during pregnancy prior to [redacted] weeks gestation 07/09/2014    Objective:    BP 126/85 mmHg  Pulse 100  Temp(Src) 97.2 F (36.2 C)  Wt 202 lb (91.627 kg)  LMP 04/01/2014 FHT:  150 BPM  Uterine Size: size equals dates  Presentation: cephalic  Pelvic Exam: Deferred    Assessment:    Pregnancy @ 4129w1d  weeks    NST:   Reactive    Plan:    Postdates management: discussed fetal surveillance and induction, discussed fetal movement.

## 2015-01-13 ENCOUNTER — Encounter (HOSPITAL_COMMUNITY): Payer: Self-pay

## 2015-01-13 ENCOUNTER — Inpatient Hospital Stay (HOSPITAL_COMMUNITY)
Admission: RE | Admit: 2015-01-13 | Discharge: 2015-01-18 | DRG: 766 | Disposition: A | Discharge status: Home / Self Care | Payer: BC Managed Care – PPO | Source: Ambulatory Visit | Attending: Obstetrics | Admitting: Obstetrics

## 2015-01-13 DIAGNOSIS — Z3A41 41 weeks gestation of pregnancy: Secondary | ICD-10-CM | POA: Diagnosis present

## 2015-01-13 DIAGNOSIS — Z833 Family history of diabetes mellitus: Secondary | ICD-10-CM | POA: Diagnosis not present

## 2015-01-13 DIAGNOSIS — O99214 Obesity complicating childbirth: Secondary | ICD-10-CM | POA: Diagnosis present

## 2015-01-13 DIAGNOSIS — Z9889 Other specified postprocedural states: Secondary | ICD-10-CM | POA: Diagnosis present

## 2015-01-13 DIAGNOSIS — Z6837 Body mass index (BMI) 37.0-37.9, adult: Secondary | ICD-10-CM

## 2015-01-13 DIAGNOSIS — O480 Post-term pregnancy: Principal | ICD-10-CM | POA: Diagnosis present

## 2015-01-13 LAB — CBC
HCT: 38.7 % (ref 36.0–46.0)
HEMOGLOBIN: 12.9 g/dL (ref 12.0–15.0)
MCH: 28.4 pg (ref 26.0–34.0)
MCHC: 33.3 g/dL (ref 30.0–36.0)
MCV: 85.1 fL (ref 78.0–100.0)
Platelets: 195 10*3/uL (ref 150–400)
RBC: 4.55 MIL/uL (ref 3.87–5.11)
RDW: 14.9 % (ref 11.5–15.5)
WBC: 10.8 10*3/uL — AB (ref 4.0–10.5)

## 2015-01-13 LAB — ABO/RH: ABO/RH(D): A POS

## 2015-01-13 LAB — TYPE AND SCREEN
ABO/RH(D): A POS
Antibody Screen: NEGATIVE

## 2015-01-13 LAB — RPR: RPR: NONREACTIVE

## 2015-01-13 MED ORDER — ZOLPIDEM TARTRATE 5 MG PO TABS: 5.0000 mg | ORAL_TABLET | Freq: Every evening | ORAL | Status: DC | PRN

## 2015-01-13 MED ORDER — LACTATED RINGERS IV SOLN
INTRAVENOUS | Status: DC
  Administered 2015-01-13 – 2015-01-15 (×5): via INTRAVENOUS

## 2015-01-13 MED ORDER — TERBUTALINE SULFATE 1 MG/ML IJ SOLN: 0.2500 mg | Freq: Once | INTRAMUSCULAR | Status: DC | PRN

## 2015-01-13 MED ORDER — MISOPROSTOL 25 MCG QUARTER TABLET
50.0000 ug | ORAL_TABLET | Freq: Four times a day (QID) | ORAL | Status: DC
  Administered 2015-01-13 (×2): 50 ug via ORAL
  Filled 2015-01-13 (×2): qty 0.5

## 2015-01-13 MED ORDER — FLEET ENEMA 7-19 GM/118ML RE ENEM: 1.0000 | ENEMA | RECTAL | Status: DC | PRN

## 2015-01-13 MED ORDER — LACTATED RINGERS IV SOLN
500.0000 mL | INTRAVENOUS | Status: DC | PRN
  Administered 2015-01-14: 300 mL via INTRAVENOUS
  Administered 2015-01-15: 1000 mL via INTRAVENOUS

## 2015-01-13 MED ORDER — ACETAMINOPHEN 325 MG PO TABS: 650.0000 mg | ORAL_TABLET | ORAL | Status: DC | PRN

## 2015-01-13 MED ORDER — OXYTOCIN BOLUS FROM INFUSION: 500.0000 mL | INTRAVENOUS | Status: DC

## 2015-01-13 MED ORDER — SODIUM CHLORIDE 0.9 % IJ SOLN
3.0000 mL | INTRAMUSCULAR | Status: DC | PRN
  Administered 2015-01-13 (×2): 3 mL via INTRAVENOUS
  Filled 2015-01-13 (×2): qty 3

## 2015-01-13 MED ORDER — CITRIC ACID-SODIUM CITRATE 334-500 MG/5ML PO SOLN
30.0000 mL | ORAL | Status: DC | PRN
  Administered 2015-01-15: 30 mL via ORAL
  Filled 2015-01-13: qty 15

## 2015-01-13 MED ORDER — OXYCODONE-ACETAMINOPHEN 5-325 MG PO TABS: 2.0000 | ORAL_TABLET | ORAL | Status: DC | PRN

## 2015-01-13 MED ORDER — ONDANSETRON HCL 4 MG/2ML IJ SOLN
4.0000 mg | Freq: Four times a day (QID) | INTRAMUSCULAR | Status: DC | PRN
  Administered 2015-01-15: 4 mg via INTRAVENOUS
  Filled 2015-01-13: qty 2

## 2015-01-13 MED ORDER — OXYTOCIN 40 UNITS IN LACTATED RINGERS INFUSION - SIMPLE MED
1.0000 m[IU]/min | INTRAVENOUS | Status: DC
  Administered 2015-01-14: 2 m[IU]/min via INTRAVENOUS
  Filled 2015-01-13: qty 1000

## 2015-01-13 MED ORDER — LIDOCAINE HCL (PF) 1 % IJ SOLN
30.0000 mL | INTRAMUSCULAR | Status: DC | PRN
  Filled 2015-01-13: qty 30

## 2015-01-13 MED ORDER — TERBUTALINE SULFATE 1 MG/ML IJ SOLN: 0.2500 mg | Freq: Once | INTRAMUSCULAR | Status: AC | PRN

## 2015-01-13 MED ORDER — OXYTOCIN 40 UNITS IN LACTATED RINGERS INFUSION - SIMPLE MED: 62.5000 mL/h | INTRAVENOUS | Status: DC

## 2015-01-13 MED ORDER — OXYCODONE-ACETAMINOPHEN 5-325 MG PO TABS: 1.0000 | ORAL_TABLET | ORAL | Status: DC | PRN

## 2015-01-13 NOTE — Progress Notes (Signed)
Katelyn BeckmannKimberly A Marks is a 29 y.o. G1P0 at 2185w0d by LMP admitted for induction of labor due to Post dates. Due date 01-06-15.  Subjective:   Objective: BP 135/73 mmHg  Pulse 95  Temp(Src) 97.5 F (36.4 C) (Oral)  Resp 18  Ht 5\' 2"  (1.575 m)  Wt 202 lb (91.627 kg)  BMI 36.94 kg/m2  LMP 04/01/2014      FHT:  FHR: 130 bpm, variability: moderate,  accelerations:  Present,  decelerations:  Absent UC:   regular, every 1-3 minutes SVE:   Dilation: 3 Effacement (%): 50 Station: -2 Exam by:: Lorretta Harp. Brown RNC   Labs: Lab Results  Component Value Date   WBC 10.8* 01/13/2015   HGB 12.9 01/13/2015   HCT 38.7 01/13/2015   MCV 85.1 01/13/2015   PLT 195 01/13/2015    Assessment / Plan: Induction of labor due to postterm,  progressing well on pitocin  Labor: Progressing normally Preeclampsia:  none Fetal Wellbeing:  Category I Pain Control:  Nubain I/D:  n/a Anticipated MOD:  NSVD  HARPER,CHARLES A 01/13/2015, 2:58 PM

## 2015-01-13 NOTE — H&P (Signed)
Katelyn BeckmannKimberly A Marks is a 29 y.o. female presenting for IOL for postdates.. Maternal Medical History:  Fetal activity: Perceived fetal activity is normal.   Last perceived fetal movement was within the past hour.    Prenatal Complications - Diabetes: none.    OB History    Gravida Para Term Preterm AB TAB SAB Ectopic Multiple Living   1         0     Past Medical History  Diagnosis Date  . Medical history non-contributory    Past Surgical History  Procedure Laterality Date  . Wisdome to    . Wisdom tooth extraction     Family History: family history includes Diabetes in her maternal grandmother and paternal grandfather. Social History:  reports that she has never smoked. She has never used smokeless tobacco. She reports that she does not drink alcohol or use illicit drugs.   Prenatal Transfer Tool  Maternal Diabetes: No Genetic Screening: Normal Maternal Ultrasounds/Referrals: Normal Fetal Ultrasounds or other Referrals:  None Maternal Substance Abuse:  No Significant Maternal Medications:  None Significant Maternal Lab Results:  None Other Comments:  None  Review of Systems  All other systems reviewed and are negative.     Blood pressure 132/75, pulse 96, temperature 97.5 F (36.4 C), temperature source Oral, resp. rate 20, height 5\' 2"  (1.575 m), weight 202 lb (91.627 kg), last menstrual period 04/01/2014. Maternal Exam:  Abdomen: Fetal presentation: vertex  Pelvis: adequate for delivery.   Cervix: Cervix evaluated by digital exam.     Physical Exam  Nursing note and vitals reviewed. Constitutional: She is oriented to person, place, and time. She appears well-developed and well-nourished.  HENT:  Head: Normocephalic and atraumatic.  Eyes: Conjunctivae are normal. Pupils are equal, round, and reactive to light.  Neck: Normal range of motion. Neck supple.  Cardiovascular: Normal rate and regular rhythm.   Respiratory: Effort normal and breath sounds normal.   GI: Soft.  Genitourinary: Vagina normal and uterus normal.  Musculoskeletal: Normal range of motion.  Neurological: She is alert and oriented to person, place, and time.  Skin: Skin is warm and dry.  Psychiatric: She has a normal mood and affect. Her behavior is normal. Judgment and thought content normal.    Prenatal labs: ABO, Rh: A/POS/-- (09/02 1703) Antibody: NEG (09/02 1703) Rubella: 3.82 (09/02 1703) RPR: NON REAC (12/22 1419)  HBsAg: NEGATIVE (09/02 1703)  HIV: NONREACTIVE (12/22 1419)  GBS: NOT DETECTED (03/02 1642)   Assessment/Plan: Postdates.  2 stage IOL.   Norvil Martensen A 01/13/2015, 7:38 AM

## 2015-01-14 ENCOUNTER — Inpatient Hospital Stay (HOSPITAL_COMMUNITY): Payer: BC Managed Care – PPO | Admitting: Anesthesiology

## 2015-01-14 MED ORDER — PHENYLEPHRINE 40 MCG/ML (10ML) SYRINGE FOR IV PUSH (FOR BLOOD PRESSURE SUPPORT)
80.0000 ug | PREFILLED_SYRINGE | INTRAVENOUS | Status: AC | PRN
  Administered 2015-01-14 (×2): 80 ug via INTRAVENOUS
  Administered 2015-01-15: 40 ug via INTRAVENOUS

## 2015-01-14 MED ORDER — OXYTOCIN 40 UNITS IN LACTATED RINGERS INFUSION - SIMPLE MED: 1.0000 m[IU]/min | INTRAVENOUS | Status: DC

## 2015-01-14 MED ORDER — FENTANYL CITRATE 0.05 MG/ML IJ SOLN
100.0000 ug | Freq: Once | INTRAMUSCULAR | Status: AC
  Administered 2015-01-14: 100 ug via INTRAVENOUS
  Filled 2015-01-14: qty 2

## 2015-01-14 MED ORDER — FENTANYL 2.5 MCG/ML BUPIVACAINE 1/10 % EPIDURAL INFUSION (WH - ANES)
14.0000 mL/h | INTRAMUSCULAR | Status: DC | PRN
  Administered 2015-01-14 – 2015-01-15 (×3): 14 mL/h via EPIDURAL
  Filled 2015-01-14 (×3): qty 125

## 2015-01-14 MED ORDER — LIDOCAINE HCL (PF) 1 % IJ SOLN
INTRAMUSCULAR | Status: DC | PRN
  Administered 2015-01-14: 6 mL
  Administered 2015-01-14: 4 mL

## 2015-01-14 MED ORDER — EPHEDRINE 5 MG/ML INJ: 10.0000 mg | INTRAVENOUS | Status: DC | PRN

## 2015-01-14 MED ORDER — PHENYLEPHRINE 40 MCG/ML (10ML) SYRINGE FOR IV PUSH (FOR BLOOD PRESSURE SUPPORT)
80.0000 ug | PREFILLED_SYRINGE | INTRAVENOUS | Status: DC | PRN
  Administered 2015-01-15: 80 ug via INTRAVENOUS
  Filled 2015-01-14 (×2): qty 20

## 2015-01-14 MED ORDER — LACTATED RINGERS IV SOLN
500.0000 mL | Freq: Once | INTRAVENOUS | Status: AC
  Administered 2015-01-14: 500 mL via INTRAVENOUS

## 2015-01-14 MED ORDER — DIPHENHYDRAMINE HCL 50 MG/ML IJ SOLN: 12.5000 mg | INTRAMUSCULAR | Status: DC | PRN

## 2015-01-14 MED ORDER — EPHEDRINE 5 MG/ML INJ
10.0000 mg | INTRAVENOUS | Status: DC | PRN
  Filled 2015-01-14: qty 4

## 2015-01-14 NOTE — Anesthesia Preprocedure Evaluation (Addendum)
Anesthesia Evaluation  Patient identified by MRN, date of birth, ID band Patient awake    Reviewed: Allergy & Precautions, H&P , NPO status , Patient's Chart, lab work & pertinent test results  Airway Mallampati: II  TM Distance: >3 FB Neck ROM: full    Dental  (+) Teeth Intact   Pulmonary  breath sounds clear to auscultation        Cardiovascular Rhythm:regular Rate:Normal     Neuro/Psych    GI/Hepatic   Endo/Other  Morbid obesity  Renal/GU      Musculoskeletal   Abdominal   Peds  Hematology   Anesthesia Other Findings       Reproductive/Obstetrics (+) Pregnancy                            Anesthesia Physical Anesthesia Plan  ASA: III and emergent  Anesthesia Plan: Epidural   Post-op Pain Management:    Induction:   Airway Management Planned: Natural Airway  Additional Equipment:   Intra-op Plan:   Post-operative Plan:   Informed Consent: I have reviewed the patients History and Physical, chart, labs and discussed the procedure including the risks, benefits and alternatives for the proposed anesthesia with the patient or authorized representative who has indicated his/her understanding and acceptance.   Dental Advisory Given  Plan Discussed with: CRNA, Anesthesiologist and Surgeon  Anesthesia Plan Comments: (Labs checked- platelets confirmed with RN in room. Fetal heart tracing, per RN, reported to be stable enough for sitting procedure. Discussed epidural, and patient consents to the procedure:  included risk of possible headache,backache, failed block, allergic reaction, and nerve injury. This patient was asked if she has questions or concerns before the procedure started.  01/15/2015 1650 Patient for C/Section for failure to progress. Will use epidural for C/section. M. Malen GauzeFoster, MD)       Anesthesia Quick Evaluation

## 2015-01-14 NOTE — Anesthesia Procedure Notes (Signed)

## 2015-01-14 NOTE — Progress Notes (Signed)
Katelyn Marks is a 29 y.o. G1P0 at 5388w1d by ultrasound admitted for induction of labor due to Post dates. Due date 01/06/15.  Subjective:   Patient able to sleep overnight and feels rested.    Objective: BP 118/75 mmHg  Pulse 88  Temp(Src) 98.2 F (36.8 C) (Oral)  Resp 20  Ht 5\' 2"  (1.575 m)  Wt 91.627 kg (202 lb)  BMI 36.94 kg/m2  LMP 04/01/2014      FHT:  FHR: 145 bpm, variability: moderate,  accelerations:  Present,  decelerations:  Absent UC:   irregular, every 3-5 minutes, mild to palpation SVE:   Dilation: 3 Effacement (%): 50 Station: -2 Exam by:: Katelyn Marks, CNM  "Katelyn Marks, CNM"  Exam unchanged overnight after two doses of Cytotec.   Labs: Lab Results  Component Value Date   WBC 10.8* 01/13/2015   HGB 12.9 01/13/2015   HCT 38.7 01/13/2015   MCV 85.1 01/13/2015   PLT 195 01/13/2015    Assessment / Plan: Induction of Labor for postterm  Plan: Start pitocin  Labor: Progressing normally Preeclampsia:  no signs or symptoms of toxicity Fetal Wellbeing:  Category I Pain Control:  Labor support without medications,  But did discuss IV pain medications and epidural I/D:  n/a Anticipated MOD:  NSVD  Katelyn Marks A 01/14/2015, 8:45 AM

## 2015-01-15 ENCOUNTER — Encounter (HOSPITAL_COMMUNITY): Payer: Self-pay

## 2015-01-15 ENCOUNTER — Encounter (HOSPITAL_COMMUNITY)
Admission: RE | Disposition: A | Discharge status: Home / Self Care | Payer: Self-pay | Source: Ambulatory Visit | Attending: Obstetrics

## 2015-01-15 SURGERY — Surgical Case
Anesthesia: Epidural

## 2015-01-15 MED ORDER — SODIUM CHLORIDE 0.9 % IJ SOLN: 3.0000 mL | INTRAMUSCULAR | Status: DC | PRN

## 2015-01-15 MED ORDER — PHENYLEPHRINE 40 MCG/ML (10ML) SYRINGE FOR IV PUSH (FOR BLOOD PRESSURE SUPPORT)
PREFILLED_SYRINGE | INTRAVENOUS | Status: AC
  Filled 2015-01-15: qty 10

## 2015-01-15 MED ORDER — MORPHINE SULFATE (PF) 0.5 MG/ML IJ SOLN
INTRAMUSCULAR | Status: DC | PRN
  Administered 2015-01-15: 4 mg via EPIDURAL

## 2015-01-15 MED ORDER — KETOROLAC TROMETHAMINE 30 MG/ML IJ SOLN
INTRAMUSCULAR | Status: AC
  Filled 2015-01-15: qty 1

## 2015-01-15 MED ORDER — WITCH HAZEL-GLYCERIN EX PADS: 1.0000 "application " | MEDICATED_PAD | CUTANEOUS | Status: DC | PRN

## 2015-01-15 MED ORDER — BUPIVACAINE HCL (PF) 0.25 % IJ SOLN
INTRAMUSCULAR | Status: AC
  Filled 2015-01-15: qty 30

## 2015-01-15 MED ORDER — KETOROLAC TROMETHAMINE 30 MG/ML IJ SOLN: 30.0000 mg | Freq: Four times a day (QID) | INTRAMUSCULAR | Status: DC | PRN

## 2015-01-15 MED ORDER — BUPIVACAINE LIPOSOME 1.3 % IJ SUSP
20.0000 mL | Freq: Once | INTRAMUSCULAR | Status: AC
  Administered 2015-01-15: 20 mL
  Filled 2015-01-15: qty 20

## 2015-01-15 MED ORDER — SCOPOLAMINE 1 MG/3DAYS TD PT72
MEDICATED_PATCH | TRANSDERMAL | Status: DC | PRN
  Administered 2015-01-15: 1 via TRANSDERMAL

## 2015-01-15 MED ORDER — DIPHENHYDRAMINE HCL 25 MG PO CAPS
25.0000 mg | ORAL_CAPSULE | ORAL | Status: DC | PRN
  Filled 2015-01-15: qty 1

## 2015-01-15 MED ORDER — SCOPOLAMINE 1 MG/3DAYS TD PT72
MEDICATED_PATCH | TRANSDERMAL | Status: AC
  Filled 2015-01-15: qty 1

## 2015-01-15 MED ORDER — LACTATED RINGERS IV SOLN
INTRAVENOUS | Status: DC | PRN
  Administered 2015-01-15: 18:00:00 via INTRAVENOUS

## 2015-01-15 MED ORDER — FENTANYL CITRATE 0.05 MG/ML IJ SOLN
INTRAMUSCULAR | Status: AC
  Filled 2015-01-15: qty 2

## 2015-01-15 MED ORDER — CEFAZOLIN SODIUM-DEXTROSE 2-3 GM-% IV SOLR
INTRAVENOUS | Status: DC | PRN
  Administered 2015-01-15: 2 g via INTRAVENOUS

## 2015-01-15 MED ORDER — SIMETHICONE 80 MG PO CHEW
80.0000 mg | CHEWABLE_TABLET | ORAL | Status: DC
  Administered 2015-01-15 – 2015-01-17 (×3): 80 mg via ORAL
  Filled 2015-01-15 (×3): qty 1

## 2015-01-15 MED ORDER — LACTATED RINGERS IV SOLN
40.0000 [IU] | INTRAVENOUS | Status: DC | PRN
  Administered 2015-01-15: 40 [IU] via INTRAVENOUS

## 2015-01-15 MED ORDER — ONDANSETRON HCL 4 MG/2ML IJ SOLN
INTRAMUSCULAR | Status: DC | PRN
  Administered 2015-01-15: 4 mg via INTRAVENOUS

## 2015-01-15 MED ORDER — BUPIVACAINE HCL (PF) 0.25 % IJ SOLN
INTRAMUSCULAR | Status: AC
  Filled 2015-01-15: qty 10

## 2015-01-15 MED ORDER — BUPIVACAINE HCL (PF) 0.25 % IJ SOLN
INTRAMUSCULAR | Status: DC | PRN
  Administered 2015-01-15: 40 mL

## 2015-01-15 MED ORDER — DEXTROSE 5 % IV SOLN
2.0000 g | Freq: Four times a day (QID) | INTRAVENOUS | Status: AC
  Administered 2015-01-15 – 2015-01-16 (×2): 2 g via INTRAVENOUS
  Filled 2015-01-15 (×2): qty 2

## 2015-01-15 MED ORDER — SODIUM BICARBONATE 8.4 % IV SOLN
INTRAVENOUS | Status: AC
  Filled 2015-01-15: qty 50

## 2015-01-15 MED ORDER — SIMETHICONE 80 MG PO CHEW: 80.0000 mg | CHEWABLE_TABLET | ORAL | Status: DC | PRN

## 2015-01-15 MED ORDER — ZOLPIDEM TARTRATE 5 MG PO TABS: 5.0000 mg | ORAL_TABLET | Freq: Every evening | ORAL | Status: DC | PRN

## 2015-01-15 MED ORDER — LACTATED RINGERS IV SOLN
INTRAVENOUS | Status: DC
  Administered 2015-01-16: 07:00:00 via INTRAVENOUS

## 2015-01-15 MED ORDER — OXYCODONE-ACETAMINOPHEN 5-325 MG PO TABS
2.0000 | ORAL_TABLET | ORAL | Status: DC | PRN
  Administered 2015-01-17 (×3): 2 via ORAL
  Filled 2015-01-15 (×3): qty 2

## 2015-01-15 MED ORDER — DIBUCAINE 1 % RE OINT: 1.0000 "application " | TOPICAL_OINTMENT | RECTAL | Status: DC | PRN

## 2015-01-15 MED ORDER — DEXAMETHASONE SODIUM PHOSPHATE 10 MG/ML IJ SOLN
INTRAMUSCULAR | Status: DC | PRN
  Administered 2015-01-15: 4 mg via INTRAVENOUS

## 2015-01-15 MED ORDER — SENNOSIDES-DOCUSATE SODIUM 8.6-50 MG PO TABS
2.0000 | ORAL_TABLET | ORAL | Status: DC
  Administered 2015-01-15 – 2015-01-17 (×3): 2 via ORAL
  Filled 2015-01-15 (×3): qty 2

## 2015-01-15 MED ORDER — NALBUPHINE HCL 10 MG/ML IJ SOLN: 5.0000 mg | Freq: Once | INTRAMUSCULAR | Status: DC | PRN

## 2015-01-15 MED ORDER — NALBUPHINE HCL 10 MG/ML IJ SOLN: 5.0000 mg | INTRAMUSCULAR | Status: DC | PRN

## 2015-01-15 MED ORDER — OXYCODONE-ACETAMINOPHEN 5-325 MG PO TABS
1.0000 | ORAL_TABLET | ORAL | Status: DC | PRN
Start: 2015-01-15 — End: 2015-01-18
  Administered 2015-01-17 – 2015-01-18 (×5): 1 via ORAL
  Filled 2015-01-15 (×5): qty 1

## 2015-01-15 MED ORDER — MORPHINE SULFATE 0.5 MG/ML IJ SOLN
INTRAMUSCULAR | Status: AC
  Filled 2015-01-15: qty 10

## 2015-01-15 MED ORDER — PHENYLEPHRINE HCL 10 MG/ML IJ SOLN
INTRAMUSCULAR | Status: DC | PRN
  Administered 2015-01-15: 40 ug via INTRAVENOUS
  Administered 2015-01-15 (×3): 80 ug via INTRAVENOUS

## 2015-01-15 MED ORDER — LIDOCAINE-EPINEPHRINE (PF) 2 %-1:200000 IJ SOLN
INTRAMUSCULAR | Status: AC
  Filled 2015-01-15: qty 20

## 2015-01-15 MED ORDER — IBUPROFEN 600 MG PO TABS: 600.0000 mg | ORAL_TABLET | Freq: Four times a day (QID) | ORAL | Status: DC | PRN

## 2015-01-15 MED ORDER — METOCLOPRAMIDE HCL 5 MG/ML IJ SOLN
INTRAMUSCULAR | Status: AC
  Filled 2015-01-15: qty 2

## 2015-01-15 MED ORDER — NALOXONE HCL 1 MG/ML IJ SOLN: 1.0000 ug/kg/h | INTRAVENOUS | Status: DC | PRN

## 2015-01-15 MED ORDER — SIMETHICONE 80 MG PO CHEW
80.0000 mg | CHEWABLE_TABLET | Freq: Three times a day (TID) | ORAL | Status: DC
  Administered 2015-01-16 – 2015-01-18 (×7): 80 mg via ORAL
  Filled 2015-01-15 (×7): qty 1

## 2015-01-15 MED ORDER — LACTATED RINGERS IV SOLN
INTRAVENOUS | Status: DC | PRN
  Administered 2015-01-15 (×4): via INTRAVENOUS

## 2015-01-15 MED ORDER — OXYTOCIN 40 UNITS IN LACTATED RINGERS INFUSION - SIMPLE MED: 62.5000 mL/h | INTRAVENOUS | Status: AC

## 2015-01-15 MED ORDER — SODIUM BICARBONATE 8.4 % IV SOLN
INTRAVENOUS | Status: DC | PRN
  Administered 2015-01-15: 7 mL via EPIDURAL
  Administered 2015-01-15: 4 mL via EPIDURAL
  Administered 2015-01-15: 3 mL via EPIDURAL
  Administered 2015-01-15: 4 mL via EPIDURAL
  Administered 2015-01-15: 5 mL via EPIDURAL

## 2015-01-15 MED ORDER — MENTHOL 3 MG MT LOZG: 1.0000 | LOZENGE | OROMUCOSAL | Status: DC | PRN

## 2015-01-15 MED ORDER — NALOXONE HCL 0.4 MG/ML IJ SOLN: 0.4000 mg | INTRAMUSCULAR | Status: DC | PRN

## 2015-01-15 MED ORDER — ONDANSETRON HCL 4 MG/2ML IJ SOLN
INTRAMUSCULAR | Status: AC
  Filled 2015-01-15: qty 2

## 2015-01-15 MED ORDER — FENTANYL CITRATE 0.05 MG/ML IJ SOLN
25.0000 ug | INTRAMUSCULAR | Status: DC | PRN
  Administered 2015-01-15 (×2): 50 ug via INTRAVENOUS

## 2015-01-15 MED ORDER — CEFAZOLIN SODIUM-DEXTROSE 2-3 GM-% IV SOLR
INTRAVENOUS | Status: AC
  Filled 2015-01-15: qty 50

## 2015-01-15 MED ORDER — ACETAMINOPHEN 325 MG PO TABS
650.0000 mg | ORAL_TABLET | ORAL | Status: DC | PRN
  Administered 2015-01-16: 650 mg via ORAL
  Filled 2015-01-15 (×2): qty 2

## 2015-01-15 MED ORDER — IBUPROFEN 600 MG PO TABS
600.0000 mg | ORAL_TABLET | Freq: Four times a day (QID) | ORAL | Status: DC
  Administered 2015-01-16 – 2015-01-18 (×9): 600 mg via ORAL
  Filled 2015-01-15 (×10): qty 1

## 2015-01-15 MED ORDER — DIPHENHYDRAMINE HCL 25 MG PO CAPS: 25.0000 mg | ORAL_CAPSULE | Freq: Four times a day (QID) | ORAL | Status: DC | PRN

## 2015-01-15 MED ORDER — SCOPOLAMINE 1 MG/3DAYS TD PT72: 1.0000 | MEDICATED_PATCH | Freq: Once | TRANSDERMAL | Status: DC

## 2015-01-15 MED ORDER — LANOLIN HYDROUS EX OINT: 1.0000 "application " | TOPICAL_OINTMENT | CUTANEOUS | Status: DC | PRN

## 2015-01-15 MED ORDER — METOCLOPRAMIDE HCL 5 MG/ML IJ SOLN
INTRAMUSCULAR | Status: DC | PRN
  Administered 2015-01-15: 10 mg via INTRAVENOUS

## 2015-01-15 MED ORDER — DEXAMETHASONE SODIUM PHOSPHATE 10 MG/ML IJ SOLN
INTRAMUSCULAR | Status: AC
  Filled 2015-01-15: qty 1

## 2015-01-15 MED ORDER — DIPHENHYDRAMINE HCL 50 MG/ML IJ SOLN: 12.5000 mg | INTRAMUSCULAR | Status: DC | PRN

## 2015-01-15 MED ORDER — ONDANSETRON HCL 4 MG/2ML IJ SOLN: 4.0000 mg | Freq: Three times a day (TID) | INTRAMUSCULAR | Status: DC | PRN

## 2015-01-15 MED ORDER — MEPERIDINE HCL 25 MG/ML IJ SOLN: 6.2500 mg | INTRAMUSCULAR | Status: DC | PRN

## 2015-01-15 MED ORDER — TETANUS-DIPHTH-ACELL PERTUSSIS 5-2.5-18.5 LF-MCG/0.5 IM SUSP
0.5000 mL | Freq: Once | INTRAMUSCULAR | Status: AC
  Administered 2015-01-17: 0.5 mL via INTRAMUSCULAR
  Filled 2015-01-15: qty 0.5

## 2015-01-15 MED ORDER — PRENATAL MULTIVITAMIN CH
1.0000 | ORAL_TABLET | Freq: Every day | ORAL | Status: DC
  Administered 2015-01-16 – 2015-01-17 (×2): 1 via ORAL
  Filled 2015-01-15 (×2): qty 1

## 2015-01-15 MED ORDER — KETOROLAC TROMETHAMINE 30 MG/ML IJ SOLN
30.0000 mg | Freq: Four times a day (QID) | INTRAMUSCULAR | Status: DC | PRN
  Administered 2015-01-15: 30 mg via INTRAMUSCULAR

## 2015-01-15 MED ORDER — OXYTOCIN 10 UNIT/ML IJ SOLN
INTRAMUSCULAR | Status: AC
  Filled 2015-01-15: qty 4

## 2015-01-15 SURGICAL SUPPLY — 41 items
CANISTER WOUND CARE 500ML ATS (WOUND CARE) IMPLANT
CLAMP CORD UMBIL (MISCELLANEOUS) IMPLANT
CLOTH BEACON ORANGE TIMEOUT ST (SAFETY) ×3 IMPLANT
CONTAINER PREFILL 10% NBF 15ML (MISCELLANEOUS) ×6 IMPLANT
DRAPE SHEET LG 3/4 BI-LAMINATE (DRAPES) IMPLANT
DRSG OPSITE POSTOP 4X10 (GAUZE/BANDAGES/DRESSINGS) ×3 IMPLANT
DRSG VAC ATS LRG SENSATRAC (GAUZE/BANDAGES/DRESSINGS) IMPLANT
DRSG VAC ATS MED SENSATRAC (GAUZE/BANDAGES/DRESSINGS) IMPLANT
DRSG VAC ATS SM SENSATRAC (GAUZE/BANDAGES/DRESSINGS) IMPLANT
DURAPREP 26ML APPLICATOR (WOUND CARE) ×3 IMPLANT
ELECT REM PT RETURN 9FT ADLT (ELECTROSURGICAL) ×3
ELECTRODE REM PT RTRN 9FT ADLT (ELECTROSURGICAL) ×1 IMPLANT
EXTRACTOR VACUUM M CUP 4 TUBE (SUCTIONS) IMPLANT
EXTRACTOR VACUUM M CUP 4' TUBE (SUCTIONS)
GLOVE BIO SURGEON STRL SZ8 (GLOVE) ×6 IMPLANT
GOWN STRL REUS W/TWL LRG LVL3 (GOWN DISPOSABLE) ×6 IMPLANT
KIT ABG SYR 3ML LUER SLIP (SYRINGE) IMPLANT
LIQUID BAND (GAUZE/BANDAGES/DRESSINGS) ×3 IMPLANT
NEEDLE HYPO 25X5/8 SAFETYGLIDE (NEEDLE) ×3 IMPLANT
NS IRRIG 1000ML POUR BTL (IV SOLUTION) ×3 IMPLANT
PACK C SECTION WH (CUSTOM PROCEDURE TRAY) ×3 IMPLANT
PAD OB MATERNITY 4.3X12.25 (PERSONAL CARE ITEMS) ×3 IMPLANT
RETRACTOR WND ALEXIS 25 LRG (MISCELLANEOUS) ×1 IMPLANT
RTRCTR C-SECT PINK 25CM LRG (MISCELLANEOUS) ×3 IMPLANT
RTRCTR WOUND ALEXIS 25CM LRG (MISCELLANEOUS) ×3
STAPLER VISISTAT 35W (STAPLE) IMPLANT
SUT GUT PLAIN 0 CT-3 TAN 27 (SUTURE) IMPLANT
SUT MNCRL 0 VIOLET CTX 36 (SUTURE) ×3 IMPLANT
SUT MNCRL AB 4-0 PS2 18 (SUTURE) IMPLANT
SUT MON AB 2-0 CT1 27 (SUTURE) ×3 IMPLANT
SUT MON AB 3-0 SH 27 (SUTURE)
SUT MON AB 3-0 SH27 (SUTURE) IMPLANT
SUT MONOCRYL 0 CTX 36 (SUTURE) ×6
SUT PDS AB 0 CTX 60 (SUTURE) IMPLANT
SUT PLAIN 2 0 XLH (SUTURE) IMPLANT
SUT VIC AB 0 CTX 36 (SUTURE) ×6
SUT VIC AB 0 CTX36XBRD ANBCTRL (SUTURE) ×3 IMPLANT
SUT VIC AB 2-0 CT1 27 (SUTURE)
SUT VIC AB 2-0 CT1 TAPERPNT 27 (SUTURE) IMPLANT
TOWEL OR 17X24 6PK STRL BLUE (TOWEL DISPOSABLE) ×3 IMPLANT
TRAY FOLEY CATH SILVER 14FR (SET/KITS/TRAYS/PACK) ×3 IMPLANT

## 2015-01-15 NOTE — Op Note (Signed)
Cesarean Section Procedure Note   Katelyn BeckmannKimberly A Kramme   01/13/2015 - 01/15/2015  Indications: Arrest of Descent   Pre-operative Diagnosis: Arrest of Descent.   Post-operative Diagnosis: Same   Surgeon: HARPER,CHARLES A  Assistants: Surgical Technician  Anesthesia: epidural  Procedure Details:  The patient was seen in the Holding Room. The risks, benefits, complications, treatment options, and expected outcomes were discussed with the patient. The patient concurred with the proposed plan, giving informed consent. The patient was identified as Katelyn Marks and the procedure verified as C-Section Delivery. A Time Out was held and the above information confirmed.  After induction of anesthesia, the patient was draped and prepped in the usual sterile manner. A transverse incision was made and carried down through the subcutaneous tissue to the fascia. The fascial incision was made and extended transversely. The fascia was separated from the underlying rectus tissue superiorly and inferiorly. The peritoneum was identified and entered. The peritoneal incision was extended longitudinally. The utero-vesical peritoneal reflection was incised transversely and the bladder flap was bluntly freed from the lower uterine segment. A low transverse uterine incision was made. Delivered from cephalic presentation was a 5102 gram living newborn female infant(s). APGAR (1 MIN): 10   APGAR (5 MINS): 10   APGAR (10 MINS):    A cord ph was not sent. The umbilical cord was clamped and cut cord. A sample was obtained for evaluation. The placenta was removed Intact and appeared normal.  The uterine incision was closed with running locked sutures of 1-0 Monocryl. A second imbricating layer of the same suture was placed.  Hemostasis was observed. The paracolic gutters were irrigated. The parieto peritoneum was closed in a running fashion with 2-0 Vicryl.  The fascia was then reapproximated with running sutures of 0 Vicryl.   The skin was closed with staples.  Instrument, sponge, and needle counts were correct prior the abdominal closure and were correct at the conclusion of the case.    Findings: Normal uterus, ovaries and tubes   Estimated Blood Loss: 1000ml   Total IV Fluids: 3500ml   Urine Output: 100CC OF clear urine  Specimens: Placenta to pathology  Complications: no complications  Disposition: PACU - hemodynamically stable.  Maternal Condition: stable   Baby condition / location:  Couplet care / Skin to Skin    Signed: Surgeon(s): Brock Badharles A Harper, MD

## 2015-01-15 NOTE — Progress Notes (Signed)
Katelyn BeckmannKimberly A Marks is a 29 y.o. G1P0 at 6928w2d by LMP admitted for induction of labor due to Post dates. Due date 01-06-15.  Subjective:   Objective: BP 113/57 mmHg  Pulse 129  Temp(Src) 99.5 F (37.5 C) (Oral)  Resp 18  Ht 5\' 2"  (1.575 m)  Wt 202 lb (91.627 kg)  BMI 36.94 kg/m2  SpO2 100%  LMP 04/01/2014      FHT:  FHR: 135 bpm, variability: moderate,  accelerations:  Present,  decelerations:  Absent UC:   regular, every 24 minutes SVE:   Dilation: 8 Effacement (%): 90 Station: 0 Exam by:: n licato rn  Labs: Lab Results  Component Value Date   WBC 10.8* 01/13/2015   HGB 12.9 01/13/2015   HCT 38.7 01/13/2015   MCV 85.1 01/13/2015   PLT 195 01/13/2015    Assessment / Plan: Induction of labor due to postterm,  progressing well on pitocin  Labor: Progressing normally Preeclampsia:  n/a Fetal Wellbeing:  Category I Pain Control:  Epidural I/D:  n/a Anticipated MOD:  NSVD  Katelyn Marks A 01/15/2015, 7:33 AM

## 2015-01-15 NOTE — Progress Notes (Signed)
Katelyn BeckmannKimberly A Marks is a 29 y.o. G1P0 at 2732w2d by LMP admitted for induction of labor due to Post dates. Due date 01-06-15.  Subjective:   Objective: BP 99/48 mmHg  Pulse 99  Temp(Src) 98.8 F (37.1 C) (Oral)  Resp 18  Ht 5\' 2"  (1.575 m)  Wt 202 lb (91.627 kg)  BMI 36.94 kg/m2  SpO2 100%  LMP 04/01/2014      FHT:  FHR: 150 bpm, variability: moderate,  accelerations:  Present,  decelerations:  Absent UC:   regular, every 4-8 minutes SVE:   Dilation: 4.5 Effacement (%): 80 Station: -2 Exam by:: n licato rn  Labs: Lab Results  Component Value Date   WBC 10.8* 01/13/2015   HGB 12.9 01/13/2015   HCT 38.7 01/13/2015   MCV 85.1 01/13/2015   PLT 195 01/13/2015    Assessment / Plan: Postdates.  2 stage IOL.  SROM, on Pitocin.  Early labor.  Increase pitocin per low dose protocol.  Labor: Early Preeclampsia:  n/a Fetal Wellbeing:  Category I Pain Control:  Epidural I/D:  n/a Anticipated MOD:  NSVD  Revel Stellmach A 01/15/2015, 2:09 AM

## 2015-01-15 NOTE — Progress Notes (Signed)
Katelyn BeckmannKimberly A Marks is a 29 y.o. G1P0 at 124w2d by LMP admitted for induction of labor due to Post dates. Due date 10-08-15.  Subjective:   Objective: BP 105/52 mmHg  Pulse 88  Temp(Src) 98.8 F (37.1 C) (Axillary)  Resp 20  Ht 5\' 2"  (1.575 m)  Wt 202 lb (91.627 kg)  BMI 36.94 kg/m2  SpO2 100%  LMP 04/01/2014      FHT:  FHR: 135 bpm, variability: moderate,  accelerations:  Present,  decelerations:  Present variable UC:   regular, every 1-3 minutes SVE:   Dilation: Lip/rim Effacement (%): 100 Station: -2 Exam by:: Katelyn SlipperJane Bailey, RN  Labs: Lab Results  Component Value Date   WBC 10.8* 01/13/2015   HGB 12.9 01/13/2015   HCT 38.7 01/13/2015   MCV 85.1 01/13/2015   PLT 195 01/13/2015    Assessment / Plan: Induction of labor due to postterm,  progressing well on pitocin  Labor: Progressing normally Preeclampsia:  n/a Fetal Wellbeing:  Category I Pain Control:  Epidural I/D:  n/a Anticipated MOD:  NSVD  HARPER,CHARLES A 01/15/2015, 1:24 PM

## 2015-01-15 NOTE — Anesthesia Postprocedure Evaluation (Signed)
  Anesthesia Post-op Note  Patient: Katelyn BeckmannKimberly A Tipping  Procedure(s) Performed: Procedure(s): CESAREAN SECTION (N/A)  Patient Location: PACU  Anesthesia Type:Epidural  Level of Consciousness: awake, alert  and oriented  Airway and Oxygen Therapy: Patient Spontanous Breathing  Post-op Pain: none  Post-op Assessment: Post-op Vital signs reviewed, Patient's Cardiovascular Status Stable and Respiratory Function Stable  Post-op Vital Signs: Reviewed and stable  Last Vitals:  Filed Vitals:   01/15/15 1930  BP: 104/69  Pulse: 88  Temp:   Resp: 15    Complications: No apparent anesthesia complications

## 2015-01-15 NOTE — Lactation Note (Signed)
This note was copied from the chart of Katelyn Matthew SarasKimberly Menees. Lactation Consultation Note Initial visit at 5 hours of age.  Baby is 11#2oz at 6897w2d.  Mom has tried a few times and baby has only sucked a few times at the breast.  Placed baby STS and assisted with cross cradle latch.  Baby does not latch and became gaggy and spit a small amount of clear mucus.  Baby now STS on mom with hiccups.  [facility] given and discussed.  Encouraged to feed with early cues on demand.  Early newborn behavior discussed.  Hand expression demonstrated with colostrum visible.  Mom to call for assist as needed.      Patient Name: Katelyn Marks VHQIO'NToday's Date: 01/15/2015 Reason for consult: Initial assessment   Maternal Data Has patient been taught Hand Expression?: Yes Does the patient have breastfeeding experience prior to this delivery?: No  Feeding Feeding Type: Breast Fed  LATCH Score/Interventions                      Lactation Tools Discussed/Used Initiated by:: RN   Consult Status Consult Status: Follow-up Date: 01/16/15 Follow-up type: In-patient    Marks, [name] 01/15/2015, 11:00 PM

## 2015-01-15 NOTE — Transfer of Care (Signed)
Immediate Anesthesia Transfer of Care Note  Patient: Katelyn Marks  Procedure(s) Performed: Procedure(s): CESAREAN SECTION (N/A)  Patient Location: PACU  Anesthesia Type:Epidural  Level of Consciousness: awake, alert  and oriented  Airway & Oxygen Therapy: Patient Spontanous Breathing  Post-op Assessment: Report given to RN and Post -op Vital signs reviewed and stable  Post vital signs: Reviewed and stable  Last Vitals:  Filed Vitals:   01/15/15 1701  BP: 122/64  Pulse: 125  Temp:   Resp: 20    Complications: No apparent anesthesia complications

## 2015-01-15 NOTE — Progress Notes (Signed)
Katelyn BeckmannKimberly A Marks is a 29 y.o. G1P0 at 3762w2d by LMP admitted for induction of labor due to Post dates. Due date 01-06-15.  Subjective:   Objective: BP 109/64 mmHg  Pulse 102  Temp(Src) 98.1 F (36.7 C) (Oral)  Resp 18  Ht 5\' 2"  (1.575 m)  Wt 202 lb (91.627 kg)  BMI 36.94 kg/m2  SpO2 100%  LMP 04/01/2014      FHT:  FHR: 135 bpm, variability: moderate,  accelerations:  Present,  decelerations:  Absent UC:   regular, every 2-3 minutes SVE:   FD / 100% / 0 / LOP ( Caput )  Labs: Lab Results  Component Value Date   WBC 10.8* 01/13/2015   HGB 12.9 01/13/2015   HCT 38.7 01/13/2015   MCV 85.1 01/13/2015   PLT 195 01/13/2015    Assessment / Plan: 41 weeks.  IOL for postdates.  Progressive dilation but poor descent.  Occiput posterior with increasing caput.  No significant descent for > 4 hours.  Will proceed with C/S for arrest of descent.  Labor: No progressive descent Preeclampsia:  n/a Fetal Wellbeing:  Category I Pain Control:  Epidural I/D:  n/a Anticipated MOD:  NSVD  Oriel Ojo A 01/15/2015, 4:56 PM

## 2015-01-16 LAB — CBC
HCT: 31.1 % — ABNORMAL LOW (ref 36.0–46.0)
HEMOGLOBIN: 10.2 g/dL — AB (ref 12.0–15.0)
MCH: 27.9 pg (ref 26.0–34.0)
MCHC: 32.8 g/dL (ref 30.0–36.0)
MCV: 85 fL (ref 78.0–100.0)
Platelets: 176 10*3/uL (ref 150–400)
RBC: 3.66 MIL/uL — ABNORMAL LOW (ref 3.87–5.11)
RDW: 15.2 % (ref 11.5–15.5)
WBC: 18.3 10*3/uL — AB (ref 4.0–10.5)

## 2015-01-16 MED ORDER — DEXTROSE 5 % IV SOLN
2.0000 g | Freq: Four times a day (QID) | INTRAVENOUS | Status: DC
  Administered 2015-01-16 – 2015-01-17 (×3): 2 g via INTRAVENOUS
  Filled 2015-01-16 (×5): qty 2

## 2015-01-16 NOTE — Anesthesia Postprocedure Evaluation (Signed)
  Anesthesia Post-op Note  Patient: Katelyn BeckmannKimberly A Marks  Procedure(s) Performed: Procedure(s): CESAREAN SECTION (N/A)  Patient Location: Mother/Baby  Anesthesia Type:Epidural  Level of Consciousness: awake and alert   Airway and Oxygen Therapy: Patient Spontanous Breathing  Post-op Pain: mild  Post-op Assessment: Post-op Vital signs reviewed, Patient's Cardiovascular Status Stable, Respiratory Function Stable, No signs of Nausea or vomiting, Pain level controlled, No headache, No residual numbness and No residual motor weakness  Post-op Vital Signs: Reviewed  Last Vitals:  Filed Vitals:   01/16/15 0330  BP: 111/65  Pulse: 94  Temp: 37.2 C  Resp: 20    Complications: No apparent anesthesia complications

## 2015-01-16 NOTE — Addendum Note (Signed)
Addendum  created 01/16/15 [id number] by Angela Adamana G Paizlee Kinder, CRNA   Modules edited: Notes Section   Notes Section:  File: [id number]

## 2015-01-16 NOTE — Lactation Note (Signed)
This note was copied from the chart of Katelyn Marks Grall. Lactation Consultation Note  Patient Name: Katelyn Marks Tipler ZOXWR'UToday's Date: 01/16/2015 Reason for consult: Follow-up assessment;Difficult latch and mom requesting LC assistance.  Baby is 22 hours old and has had multiple feedings with LATCH score=8 per RN assessment.  LC assisted parents to encourage baby to be awake for feeding by placing her STS after a diaper change and with just brief support of breast during latch, baby latches well and begins rhythmical sucking bursts with swallows.  FOB shown how to assist and LC discussed supply and demand, cluster feedings, normal breastfeeding pattern now that baby almost 24 hours old, as well as output guidelines, with baby now exceeding minimum for both wets and stools.  LC reported LC assessment to RN, Val. LC encouraged continued cue feedings with STS and diaper changes prior to latch attempts if baby is sleepy.   Maternal Data    Feeding Feeding Type: Breast Fed  LATCH Score/Interventions Latch: Grasps breast easily, tongue down, lips flanged, rhythmical sucking.  Audible Swallowing: Spontaneous and intermittent Intervention(s): Alternate breast massage;Hand expression;Skin to skin  Type of Nipple: Everted at rest and after stimulation  Comfort (Breast/Nipple): Soft / non-tender     Hold (Positioning): Assistance needed to correctly position infant at breast and maintain latch. Intervention(s): Breastfeeding basics reviewed;Support Pillows;Position options;Skin to skin (recommend across-the-lap if baby is tummy to tummy because of better control of baby and breast)  LATCH Score: 9 (LC assisted and observed)  Lactation Tools Discussed/Used   Positioning, breast support and compression during feedings, signs of proper latch and milk transfer Cue feedings, cluster feedings, normal breastfeeding and output patterns for a newborn  Consult Status Consult Status: Follow-up Date:  01/17/15 Follow-up type: In-patient    Warrick ParisianBryant, Markeesha Char [facility] 01/16/2015, 4:32 PM

## 2015-01-16 NOTE — Progress Notes (Signed)
Patient ID: Katelyn Marks, female   DOB: [birth year], 29 y.o.   MRN: [id number] Postop day 1 Blood pressure 111/65 respirations 20 pulse 94 Patient has no complaints Abdomen soft Lochia moderate Legs negative doing well

## 2015-01-17 NOTE — Progress Notes (Signed)
Patient ID: Katelyn Marks, female   DOB: [birth year], 29 y.o.   MRN: [id number] Postop day 2 Vital signs normal Abdomen soft Legs negative doing well

## 2015-01-17 NOTE — Lactation Note (Signed)
This note was copied from the chart of Katelyn Marks. Lactation Consultation Note  Baby was latched upon entering with assistance from MicrosoftDonna RN. LC observed sucks and some swallows with breast massage. Mother prefers to breastfeed without NS.   Mother's nipples are starting to get tender so she is applying ebm and provided mother w/ comfort gels. Mom encouraged to feed baby 8-12 times/24 hours and with feeding cues.  Reviewed cluster feeding and suggest she call if she needs further assistance.  Patient Name: Katelyn Matthew SarasKimberly Deberry ZOXWR'UToday's Date: 01/17/2015 Reason for consult: Follow-up assessment   Maternal Data    Feeding Feeding Type: Breast Fed (latched upon entering)  LATCH Score/Interventions Latch: Grasps breast easily, tongue down, lips flanged, rhythmical sucking. Intervention(s): Assist with latch;Adjust position  Audible Swallowing: Spontaneous and intermittent  Type of Nipple: Everted at rest and after stimulation  Comfort (Breast/Nipple): Filling, red/small blisters or bruises, mild/mod discomfort  Problem noted: Mild/Moderate discomfort  Hold (Positioning): Assistance needed to correctly position infant at breast and maintain latch.  LATCH Score: 8  Lactation Tools Discussed/Used     Consult Status Consult Status: Follow-up Date: 01/18/15 Follow-up type: In-patient    Dahlia ByesBerkelhammer, Joesph Marcy [facility] 01/17/2015, 12:01 PM

## 2015-01-18 ENCOUNTER — Encounter (HOSPITAL_COMMUNITY): Payer: Self-pay | Admitting: Obstetrics

## 2015-01-18 MED ORDER — IBUPROFEN 600 MG PO TABS: 600.0000 mg | ORAL_TABLET | Freq: Four times a day (QID) | ORAL | Status: DC | PRN

## 2015-01-18 MED ORDER — OXYCODONE-ACETAMINOPHEN 5-325 MG PO TABS: 1.0000 | ORAL_TABLET | ORAL | Status: DC | PRN

## 2015-01-18 NOTE — Discharge Summary (Signed)
Obstetric Discharge Summary Reason for Admission: induction of labor Prenatal Procedures: NST and ultrasound Intrapartum Procedures: cesarean: low cervical, transverse Postpartum Procedures: antibiotics Complications-Operative and Postpartum: none HEMOGLOBIN  Date Value Ref Range Status  01/16/2015 10.2* 12.0 - 15.0 g/dL Final   HCT  Date Value Ref Range Status  01/16/2015 31.1* 36.0 - 46.0 % Final    Physical Exam:  General: alert and no distress Lochia: appropriate Uterine Fundus: firm Incision: healing well DVT Evaluation: No evidence of DVT seen on physical exam.  Discharge Diagnoses: Term Pregnancy-delivered  Discharge Information: Date: 01/18/2015 Activity: pelvic rest Diet: routine Medications: PNV, Ibuprofen, Colace and Percocet Condition: stable Instructions: refer to practice specific booklet Discharge to: home Follow-up Information    Follow up with HARPER,CHARLES A, MD. Schedule an appointment as soon as possible for a visit in 2 weeks.   Specialty:  Obstetrics and Gynecology   Contact information:   [address] [place] [address] [phone]       Newborn Data: Live born female  Birth Weight: 11 lb 2.8 oz (5069 g) APGAR: 10, 10  Home with mother.  HARPER,CHARLES A 01/18/2015, 7:59 AM

## 2015-01-18 NOTE — Progress Notes (Signed)
Subjective: Postpartum Day 3: Cesarean Delivery Patient reports tolerating PO, + flatus, + BM and no problems voiding.    Objective: Vital signs in last 24 hours: Temp:  [98.2 F (36.8 C)-98.3 F (36.8 C)] 98.2 F (36.8 C) (04/11 0536) Pulse Rate:  [85] 85 (04/11 0536) Resp:  [18-19] 19 (04/11 0536) BP: (114-119)/(67-70) 114/67 mmHg (04/11 0536) SpO2:  [97 %] 97 % (04/11 0536)  Physical Exam:  General: alert and no distress Lochia: appropriate Uterine Fundus: firm Incision: healing well DVT Evaluation: No evidence of DVT seen on physical exam.   Recent Labs  01/16/15 0600  HGB 10.2*  HCT 31.1*    Assessment/Plan: Status post Cesarean section. Doing well postoperatively.  Discharge home with standard precautions and return to clinic in 2 weeks.  Katelyn Marks A 01/18/2015, 7:56 AM

## 2015-01-28 ENCOUNTER — Encounter: Payer: Self-pay | Admitting: Obstetrics

## 2015-01-28 ENCOUNTER — Ambulatory Visit (INDEPENDENT_AMBULATORY_CARE_PROVIDER_SITE_OTHER): Payer: BC Managed Care – PPO | Admitting: Obstetrics

## 2015-01-28 DIAGNOSIS — O9089 Other complications of the puerperium, not elsewhere classified: Secondary | ICD-10-CM

## 2015-01-28 DIAGNOSIS — R52 Pain, unspecified: Secondary | ICD-10-CM

## 2015-01-28 DIAGNOSIS — Z30014 Encounter for initial prescription of intrauterine contraceptive device: Secondary | ICD-10-CM

## 2015-01-28 MED ORDER — OXYCODONE-ACETAMINOPHEN 10-325 MG PO TABS: 1.0000 | ORAL_TABLET | Freq: Four times a day (QID) | ORAL | Status: DC | PRN

## 2015-01-28 NOTE — Progress Notes (Signed)
Subjective:     Katelyn Marks is a 29 y.o. female who presents for a postpartum visit. She is 2 weeks postpartum following a low cervical transverse Cesarean section. I have fully reviewed the prenatal and intrapartum course. The delivery was at 41 gestational weeks. Outcome: primary cesarean section, low transverse incision. Anesthesia: epidural. Postpartum course has been normal. Baby's course has been normal. Baby is feeding by breast. Bleeding thin lochia. Bowel function is normal. Bladder function is normal. Patient is not sexually active. Contraception method is abstinence. Postpartum depression screening: negative.  Tobacco, alcohol and substance abuse history reviewed.  Adult immunizations reviewed including TDAP, rubella and varicella.  The following portions of the patient's history were reviewed and updated as appropriate: allergies, current medications, past family history, past medical history, past social history, past surgical history and problem list.  Review of Systems A comprehensive review of systems was negative.   Objective:    BP 118/79 mmHg  Pulse 91  Temp(Src) 96.9 F (36.1 C)  Ht 5\' 2"  (1.575 m)  Wt 171 lb (77.565 kg)  BMI 31.27 kg/m2  Breastfeeding? Yes    100% of 10 min visit spent on counseling and coordination of care.  Assessment:     2 weeks postpartum.  Doing well.  Contraception management  Plan:    1. Contraception: IUD 2. ParaGuard Rx 3. Follow up in: 6 weeks or as needed.   Healthy lifestyle practices reviewed

## 2015-02-04 ENCOUNTER — Telehealth: Payer: Self-pay | Admitting: *Deleted

## 2015-02-04 NOTE — Telephone Encounter (Signed)
Patient is interested in the ParaGard IUD for contraception. Per Dr. Clearance CootsHarper may schedule 6 weeks from her last appointment.  Patient states she has not resumed intercourse since delivery. Patient has been scheduled for 03-11-15 and has been advised not to resume intercourse until after insertion of the ParaGard. Patient verbalized understanding.

## 2015-03-07 IMAGING — US US OB COMP +14 WK
1 series · 12 of 28 positions shown · non-contrast
Comparison: none

OBSTETRICS REPORT
                      (Signed Final 08/04/2014 [DATE])

 Name:       FLORENCE ADWOA MAI                Visit Date: 08/03/2014 [DATE]
Service(s) Provided
 US OB COMP + 14 WK                                    76805.1
Indications
 Basic anatomic survey                                 [id number]
 17 weeks gestation of pregnancy
Fetal Evaluation
 Num Of Fetuses:    1
 Fetal Heart Rate:  149                          bpm
 Cardiac Activity:  Observed
 Presentation:      Variable
 Placenta:          Anterior, above cervical os
 P. Cord            Visualized, central
 Insertion:
 Amniotic Fluid
 AFI FV:      Subjectively within normal limits
                                             Larg Pckt:     4.3  cm
Biometry
 BPD:     38.5  mm     G. Age:  17w 5d                CI:        73.04   70 - 86
                                                      FL/HC:      16.3   15.8 -
                                                                         18
 HC:     143.2  mm     G. Age:  17w 4d       35  %    HC/AC:      1.18   1.07 -
 AC:     121.4  mm     G. Age:  17w 6d       52  %    FL/BPD:
 FL:      23.4  mm     G. Age:  17w 0d       22  %    FL/AC:      19.3   20 - 24
 CER:     16.6  mm     G. Age:  16w 4d       25  %
 NFT:     2.64  mm
 Est. FW:     196  gm      0 lb 7 oz     45  %
Gestational Age
 LMP:           17w 5d        Date:  04/01/14                 EDD:   01/06/15
 U/S Today:     17w 4d                                        EDD:   01/07/15
 Best:          17w 5d     Det. By:  LMP  (04/01/14)          EDD:   01/06/15
Anatomy
 Cranium:          Appears normal         Aortic Arch:      Not well visualized
 Fetal Cavum:      Not well visualized    Ductal Arch:      Appears normal
 Ventricles:       Appears normal         Diaphragm:        Not well visualized
 Choroid Plexus:   Appears normal         Stomach:          Appears normal, left
                                                            sided
 Cerebellum:       Appears normal         Abdomen:          Appears normal
 Posterior Fossa:  Appears normal         Abdominal Wall:   Appears nml (cord
                                                            insert, abd wall)
 Nuchal Fold:      Appears normal         Cord Vessels:     Appears normal (3
                                                            vessel cord)
 Face:             Appears normal         Kidneys:          Appear normal
                   (orbits and profile)
 Lips:             Appears normal         Bladder:          Appears normal
 Heart:            Not well visualized    Spine:            Not well visualized
 RVOT:             Not well visualized    Lower             Appears normal
                                          Extremities:
 LVOT:             Not well visualized    Upper             Appears normal
 Other:  Fetus appears to be a female. Heels and 5th digit visualized. Nasal
         bone visualized. Technically difficult due to early gestational age.
Targeted Anatomy
 Fetal Central Nervous System
 Cisterna Magna:
Cervix Uterus Adnexa
 Cervical Length:    4.13     cm
 Cervix:       Normal appearance by transabdominal scan.
 Left Ovary:    Within normal limits.
 Right Ovary:   Within normal limits.
 Adnexa:     No abnormality visualized.
Impression
INDICATION: 28 yr old G1P0 at 90w4d for fetal anatomic survey.
 Remote read.

[Series 1: us ob comp +14 wk mfm · 12 of 72 slices shown]
[im 3/72]
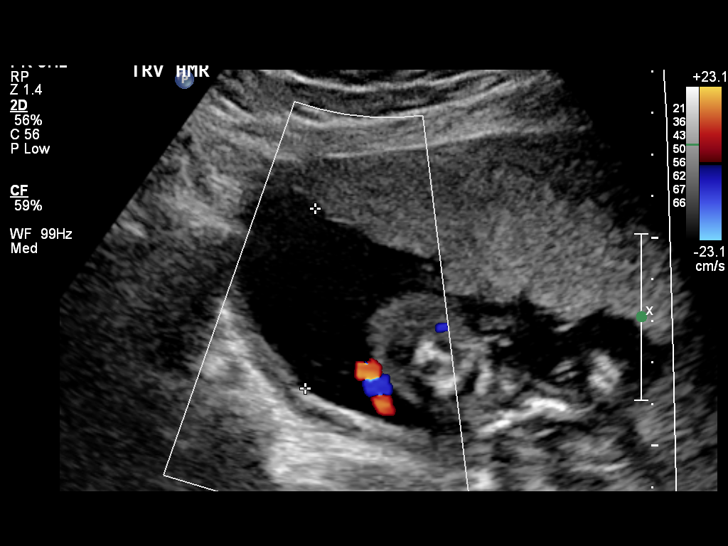
[im 8/72]
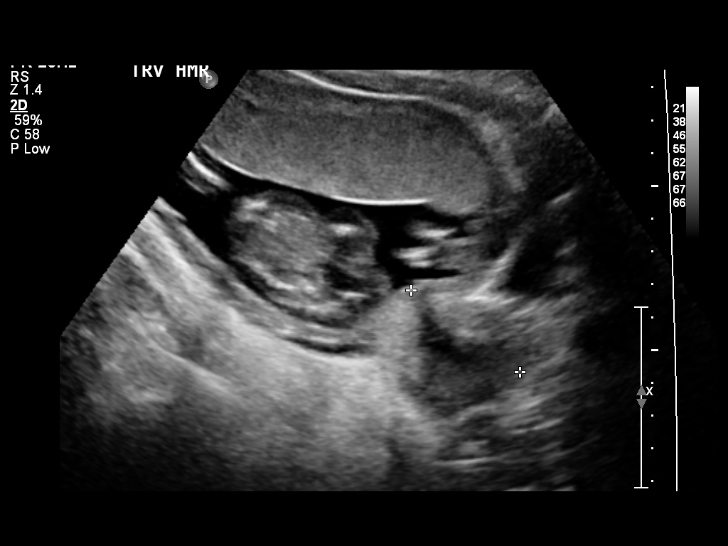
[im 14/72]
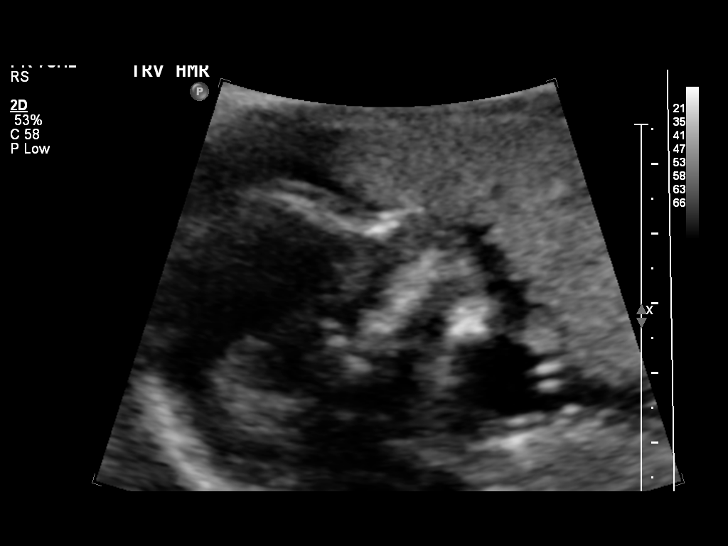
[im 22/72]
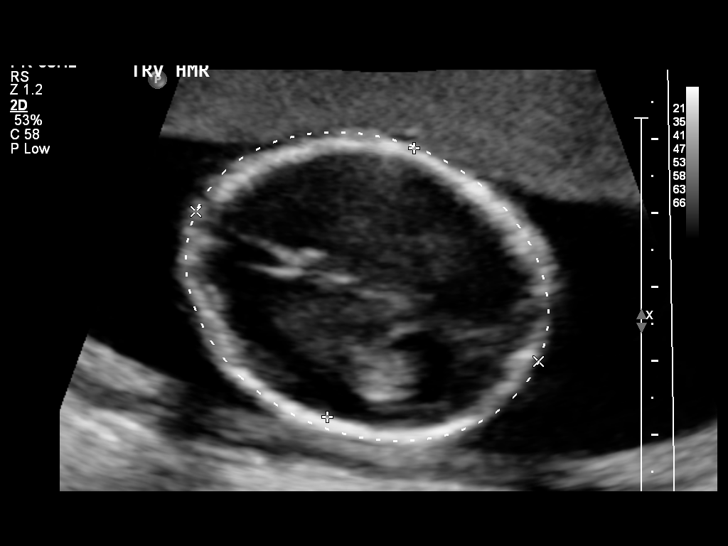
[im 27/72]
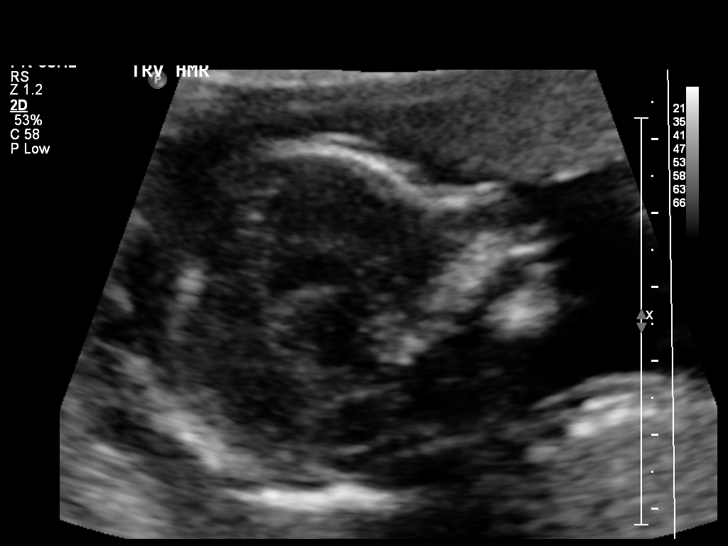
[im 32/72]
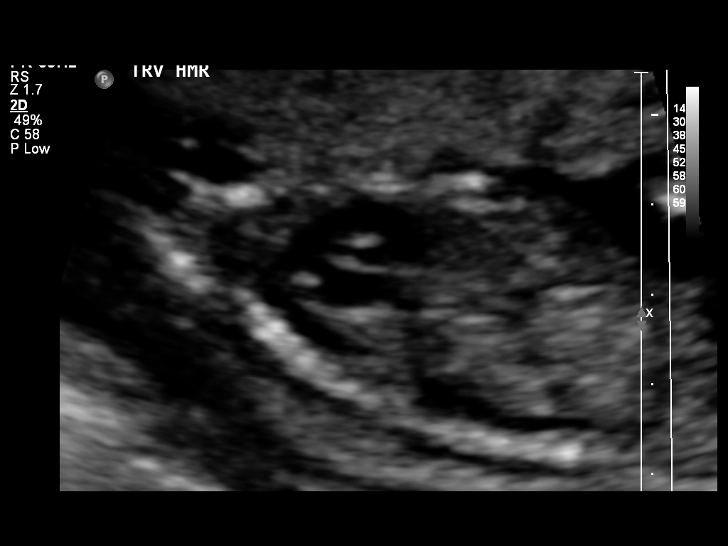
[im 40/72]
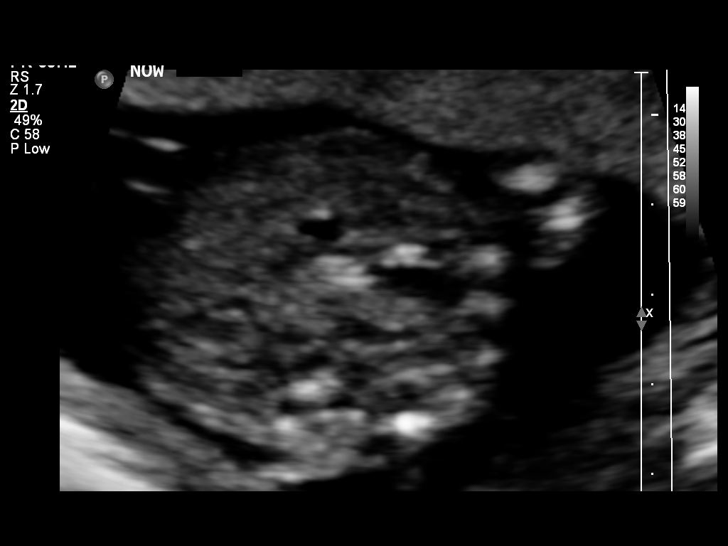
[im 45/72]
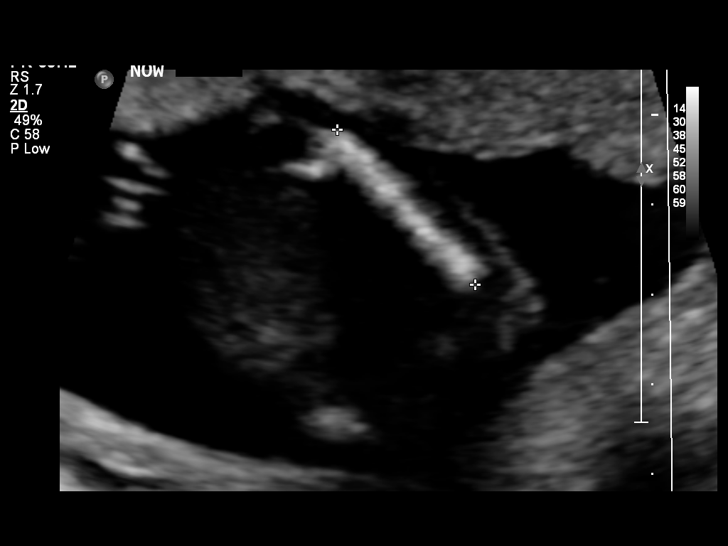
[im 50/72]
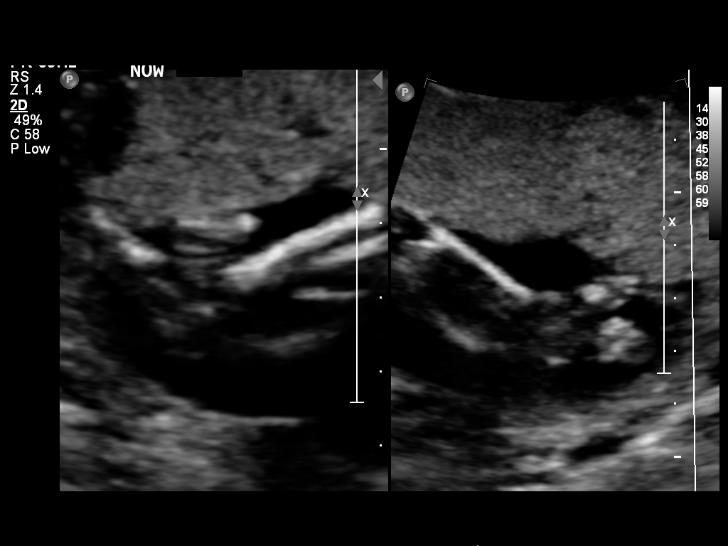
[im 58/72]
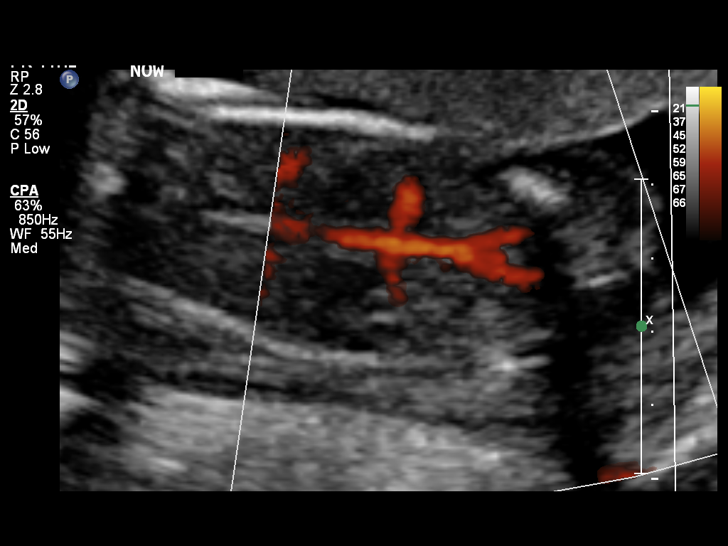
[im 64/72]
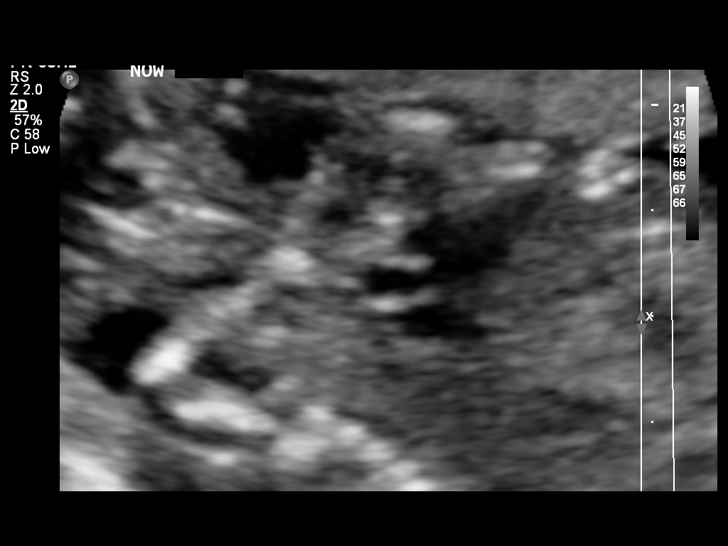
[im 69/72]
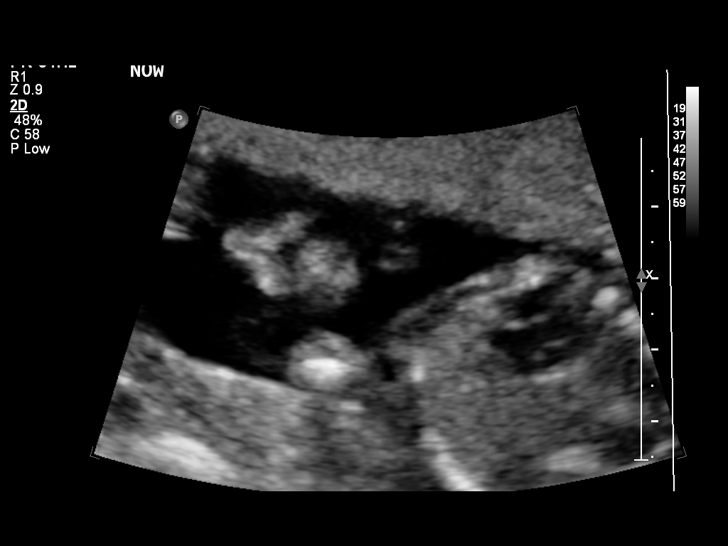

[12 of 28 positions shown; findings below may reference images not displayed]

Findings;
 1. Single intrauterine pregnancy.
 2. Fetal biometry is consistent with dating.
 3. Anterior placenta without evidence of previa.
 4. Normal amniotic fluid volume.
 5. Normal transabdominal cervical length.
 6. The views of the cavum, spine, diaphragm, heart, and
 ankles are limited.
 7. The remainder of the limited anatomy survey is normal.
Recommendations

 1. Appropriate fetal growth.
 2. Limited anatomy survey:
 - recommend follow up in 3-4 weeks to complete anatomic
 survey
 3. Low risk quad screen

## 2015-03-11 ENCOUNTER — Ambulatory Visit (INDEPENDENT_AMBULATORY_CARE_PROVIDER_SITE_OTHER): Payer: BC Managed Care – PPO | Admitting: Obstetrics

## 2015-03-11 ENCOUNTER — Encounter: Payer: Self-pay | Admitting: Obstetrics

## 2015-03-11 VITALS — BP 118/81 | HR 80 | Temp 98.5°F | Ht 62.0 in | Wt 174.0 lb

## 2015-03-11 DIAGNOSIS — Z30430 Encounter for insertion of intrauterine contraceptive device: Secondary | ICD-10-CM

## 2015-03-11 DIAGNOSIS — Z01812 Encounter for preprocedural laboratory examination: Secondary | ICD-10-CM

## 2015-03-11 NOTE — Progress Notes (Signed)
IUD Insertion Procedure Note  Pre-operative Diagnosis: Desires Contraception  Post-operative Diagnosis: same  Indications: contraception  Procedure Details  Urine pregnancy test was done in office and result was negative.  The risks (including infection, bleeding, pain, and uterine perforation) and benefits of the procedure were explained to the patient and Written informed consent was obtained.    Cervix cleansed with Betadine. Uterus sounded to 8 cm. IUD inserted without difficulty. String visible and trimmed. Patient tolerated procedure well.  IUD Information: ParaGard, Lot # [id number], Expiration date JAN 2022.  Condition: Stable  Complications: None  Plan:  The patient was advised to call for any fever or for prolonged or severe pain or bleeding. She was advised to use NSAID as needed for mild to moderate pain.   Attending Physician Documentation: I was present for or participated in the entire procedure, including opening and closing.

## 2015-03-13 LAB — SURESWAB, VAGINOSIS/VAGINITIS PLUS
ATOPOBIUM VAGINAE: NOT DETECTED Log (cells/mL)
C. GLABRATA, DNA: NOT DETECTED
C. PARAPSILOSIS, DNA: NOT DETECTED
C. TROPICALIS, DNA: NOT DETECTED
C. albicans, DNA: NOT DETECTED
C. trachomatis RNA, TMA: NOT DETECTED
GARDNERELLA VAGINALIS: NOT DETECTED Log (cells/mL)
LACTOBACILLUS SPECIES: 7.1 Log (cells/mL)
MEGASPHAERA SPECIES: NOT DETECTED Log (cells/mL)
N. GONORRHOEAE RNA, TMA: NOT DETECTED
T. VAGINALIS RNA, QL TMA: NOT DETECTED

## 2015-04-22 ENCOUNTER — Ambulatory Visit (INDEPENDENT_AMBULATORY_CARE_PROVIDER_SITE_OTHER): Payer: BC Managed Care – PPO | Admitting: Obstetrics

## 2015-04-22 ENCOUNTER — Encounter: Payer: Self-pay | Admitting: Obstetrics

## 2015-04-22 VITALS — BP 112/75 | HR 66 | Temp 97.5°F | Ht 62.0 in | Wt 173.0 lb

## 2015-04-22 DIAGNOSIS — Z30431 Encounter for routine checking of intrauterine contraceptive device: Secondary | ICD-10-CM | POA: Diagnosis not present

## 2015-04-22 NOTE — Progress Notes (Signed)
Subjective:    Katelyn Marks is a 29 y.o. female who presents for 6 week follow up after ParaGuard insertion. The patient has no complaints today. The patient is sexually active. Pertinent past medical history: none.  The information documented in the HPI was reviewed and verified.  Menstrual History: OB History    Gravida Para Term Preterm AB TAB SAB Ectopic Multiple Living   1 1 1       0 1       No LMP recorded.   Patient Active Problem List   Diagnosis Date Noted  . Arrest of descent, delivered, current hospitalization 01/15/2015  . Status post induction of labor 01/13/2015  . Nausea and vomiting during pregnancy prior to [redacted] weeks gestation 07/09/2014   Past Medical History  Diagnosis Date  . Medical history non-contributory     Past Surgical History  Procedure Laterality Date  . Wisdome to    . Wisdom tooth extraction    . Cesarean section N/A 01/15/2015    Procedure: CESAREAN SECTION;  Surgeon: Brock Badharles A Joniqua Sidle, MD;  Location: [facility];  Service: Obstetrics;  Laterality: N/A;     Current outpatient prescriptions:  .  Prenatal Vit-Fe Fumarate-FA (PREPLUS) 27-1 MG TABS, TAKE 1 TABLET BY MOUTH EVERY DAY BEFORE BREAKFAST., Disp: 90 tablet, Rfl: 3 Allergies  Allergen Reactions  . Ciprofloxacin Nausea And Vomiting    History  Substance Use Topics  . Smoking status: Never Smoker   . Smokeless tobacco: Never Used  . Alcohol Use: No    Family History  Problem Relation Age of Onset  . Diabetes Maternal Grandmother   . Diabetes Paternal Grandfather        Review of Systems Constitutional: negative for weight loss Genitourinary:negative for abnormal menstrual periods and vaginal discharge   Objective:   BP 112/75 mmHg  Pulse 66  Temp(Src) 97.5 F (36.4 C)  Ht 5\' 2"  (1.575 m)  Wt 173 lb (78.472 kg)  BMI 31.63 kg/m2  Breastfeeding? Yes           General:  Alert and no distress. Abdomen:  normal findings: no organomegaly, soft, non-tender and no hernia   Pelvis:  External genitalia: normal general appearance Urinary system: urethral meatus normal and bladder without fullness, nontender Vaginal: normal without tenderness, induration or masses Cervix: normal appearance.  IUD string visible and normal length. Adnexa: normal bimanual exam Uterus: anteverted and non-tender, normal size   Lab Review Urine pregnancy test Labs reviewed yes Radiologic studies reviewed no    Assessment:    29 y.o., continuing IUD, no contraindications.   Plan:    All questions answered. Contraception: IUD.  No orders of the defined types were placed in this encounter.   No orders of the defined types were placed in this encounter.

## 2015-07-19 ENCOUNTER — Encounter: Payer: Self-pay | Admitting: *Deleted

## 2015-07-26 ENCOUNTER — Ambulatory Visit (INDEPENDENT_AMBULATORY_CARE_PROVIDER_SITE_OTHER): Payer: BC Managed Care – PPO | Admitting: Obstetrics

## 2015-07-26 ENCOUNTER — Encounter: Payer: Self-pay | Admitting: Obstetrics

## 2015-07-26 VITALS — BP 113/79 | HR 86 | Temp 97.6°F | Wt 179.0 lb

## 2015-07-26 DIAGNOSIS — Z01419 Encounter for gynecological examination (general) (routine) without abnormal findings: Secondary | ICD-10-CM | POA: Diagnosis not present

## 2015-07-26 DIAGNOSIS — Z30431 Encounter for routine checking of intrauterine contraceptive device: Secondary | ICD-10-CM

## 2015-07-26 NOTE — Progress Notes (Signed)
Subjective:        Katelyn Marks is a 29 y.o. female here for a routine exam.  Current complaints: none.    Personal health questionnaire:  Is patient Katelyn Marks, have a family history of breast and/or ovarian cancer: no Is there a family history of uterine cancer diagnosed at age < 57, gastrointestinal cancer, urinary tract cancer, family member who is a Personnel officer syndrome-associated carrier: no Is the patient overweight and hypertensive, family history of diabetes, personal history of gestational diabetes, preeclampsia or PCOS: no Is patient over 15, have PCOS,  family history of premature CHD under age 49, diabetes, smoke, have hypertension or peripheral artery disease:  no At any time, has a partner hit, kicked or otherwise hurt or frightened you?: no Over the past 2 weeks, have you felt down, depressed or hopeless?: no Over the past 2 weeks, have you felt little interest or pleasure in doing things?:no   Gynecologic History Patient's last menstrual period was 06/30/2015. Contraception: IUD Last Pap: 2014. Results were: normal Last mammogram: n/a. Results were: n/a  Obstetric History OB History  Gravida Para Term Preterm AB SAB TAB Ectopic Multiple Living  0 1    # Outcome Date GA Lbr Len/2nd Weight Sex Delivery Anes PTL Lv  1 Term 01/15/15 [redacted]w[redacted]d 17:29 / 04:24 11 lb 2.8 oz (5.069 kg) F CS-LTranv EPI  Y     Comments: caput, 5.09 kg      Past Medical History  Diagnosis Date  . Medical history non-contributory     Past Surgical History  Procedure Laterality Date  . Wisdome to    . Wisdom tooth extraction    . Cesarean section N/A 01/15/2015    Procedure: CESAREAN SECTION;  Surgeon: Brock Bad, MD;  Location: [facility];  Service: Obstetrics;  Laterality: N/A;     Current outpatient prescriptions:  .  Prenatal Vit-Fe Fumarate-FA (PREPLUS) 27-1 MG TABS, TAKE 1 TABLET BY MOUTH EVERY DAY BEFORE BREAKFAST., Disp: 90 tablet, Rfl: 3 Allergies  Allergen  Reactions  . Ciprofloxacin Nausea And Vomiting    Social History  Substance Use Topics  . Smoking status: Never Smoker   . Smokeless tobacco: Never Used  . Alcohol Use: No    Family History  Problem Relation Age of Onset  . Diabetes Maternal Grandmother   . Diabetes Paternal Grandfather       Review of Systems  Constitutional: negative for fatigue and weight loss Respiratory: negative for cough and wheezing Cardiovascular: negative for chest pain, fatigue and palpitations Gastrointestinal: negative for abdominal pain and change in bowel habits Musculoskeletal:negative for myalgias Neurological: negative for gait problems and tremors Behavioral/Psych: negative for abusive relationship, depression Endocrine: negative for temperature intolerance   Genitourinary:negative for abnormal menstrual periods, genital lesions, hot flashes, sexual problems and vaginal discharge Integument/breast: negative for breast lump, breast tenderness, nipple discharge and skin lesion(s)    Objective:       BP 113/79 mmHg  Pulse 86  Temp(Src) 97.6 F (36.4 C)  Wt 179 lb (81.194 kg)  LMP 06/30/2015  Breastfeeding? Yes General:   alert  Skin:   no rash or abnormalities  Lungs:   clear to auscultation bilaterally  Heart:   regular rate and rhythm, S1, S2 normal, no murmur, click, rub or gallop  Breasts:   normal without suspicious masses, skin or nipple changes or axillary nodes  Abdomen:  normal findings: no organomegaly, soft, non-tender and no  hernia  Pelvis:  External genitalia: normal general appearance Urinary system: urethral meatus normal and bladder without fullness, nontender Vaginal: normal without tenderness, induration or masses Cervix: normal appearance.  IUD string visible Adnexa: normal bimanual exam Uterus: anteverted and non-tender, normal size   Lab Review Urine pregnancy test Labs reviewed yes Radiologic studies reviewed no    Assessment:    Healthy female exam.     Plan:    Education reviewed: calcium supplements, low fat, low cholesterol diet, safe sex/STD prevention, self breast exams and weight bearing exercise. Contraception: IUD. Follow up in: 1 year.   No orders of the defined types were placed in this encounter.   Orders Placed This Encounter  Procedures  . SureSwab Bacterial Vaginosis/itis

## 2015-07-28 LAB — PAP IG W/ RFLX HPV ASCU

## 2015-07-31 LAB — SURESWAB BACTERIAL VAGINOSIS/ITIS
ATOPOBIUM VAGINAE: NOT DETECTED Log (cells/mL)
C. ALBICANS, DNA: NOT DETECTED
C. PARAPSILOSIS, DNA: NOT DETECTED
C. glabrata, DNA: NOT DETECTED
C. tropicalis, DNA: NOT DETECTED
Gardnerella vaginalis: NOT DETECTED Log (cells/mL)
LACTOBACILLUS SPECIES: 7.6 Log (cells/mL)
MEGASPHAERA SPECIES: NOT DETECTED Log (cells/mL)
T. vaginalis RNA, QL TMA: NOT DETECTED

## 2016-07-31 ENCOUNTER — Ambulatory Visit: Payer: Self-pay | Admitting: Obstetrics

## 2016-08-30 ENCOUNTER — Ambulatory Visit (INDEPENDENT_AMBULATORY_CARE_PROVIDER_SITE_OTHER): Payer: BC Managed Care – PPO | Admitting: Family Medicine

## 2016-08-30 ENCOUNTER — Encounter: Payer: Self-pay | Admitting: Family Medicine

## 2016-08-30 VITALS — BP 119/83 | HR 82 | Ht 64.0 in | Wt 164.0 lb

## 2016-08-30 DIAGNOSIS — Z01419 Encounter for gynecological examination (general) (routine) without abnormal findings: Secondary | ICD-10-CM | POA: Diagnosis not present

## 2016-08-30 NOTE — Progress Notes (Signed)
   Subjective:     Katelyn BeckmannKimberly A Marks is a 30 y.o. female and is here for a comprehensive physical exam. The patient reports no problems. Has copper T IUD, regular cycles are not heavy. Normal pap 1 year ago.  Social History   Social History  . Marital status: Married    Spouse name: N/A  . Number of children: N/A  . Years of education: N/A   Occupational History  . Not on file.   Social History Main Topics  . Smoking status: Never Smoker  . Smokeless tobacco: Never Used  . Alcohol use 0.0 oz/week     Comment: occ.  . Drug use: No  . Sexual activity: Yes    Partners: Male    Birth control/ protection: IUD   Other Topics Concern  . Not on file   Social History Narrative  . No narrative on file   Health Maintenance  Topic Date Due  . INFLUENZA VACCINE  05/09/2016  . PAP SMEAR  07/25/2018  . TETANUS/TDAP  01/16/2025  . HIV Screening  Completed    The following portions of the patient's history were reviewed and updated as appropriate: allergies, current medications, past family history, past medical history, past social history, past surgical history and problem list.  Review of Systems Pertinent items noted in HPI and remainder of comprehensive ROS otherwise negative.   Objective:    BP 119/83   Pulse 82   Ht 5\' 4"  (1.626 m)   Wt 164 lb (74.4 kg)   LMP 08/13/2016   BMI 28.15 kg/m  General appearance: alert, cooperative and appears stated age Head: Normocephalic, without obvious abnormality, atraumatic Neck: no adenopathy, supple, symmetrical, trachea midline and thyroid not enlarged, symmetric, no tenderness/mass/nodules Lungs: clear to auscultation bilaterally Breasts: normal appearance, no masses or tenderness Heart: regular rate and rhythm, S1, S2 normal, no murmur, click, rub or gallop Abdomen: soft, non-tender; bowel sounds normal; no masses,  no organomegaly Pelvic: cervix normal in appearance, external genitalia normal, no adnexal masses or tenderness,  no cervical motion tenderness, uterus normal size, shape, and consistency, vagina normal without discharge and IUD strings noted Extremities: extremities normal, atraumatic, no cyanosis or edema Pulses: 2+ and symmetric Skin: Skin color, texture, turgor normal. No rashes or lesions Lymph nodes: Cervical, supraclavicular, and axillary nodes normal. Neurologic: Grossly normal    Assessment:    Healthy female exam.      Plan:   Problem List Items Addressed This Visit    None    Visit Diagnoses    Encounter for gynecological examination without abnormal finding    -  Primary   Relevant Orders   CBC   Comprehensive metabolic panel   TSH   Vitamin D (25 hydroxy)   Lipid panel     S/p flu--annual labs Continue IUD    See After Visit Summary for Counseling Recommendations

## 2016-08-31 LAB — LIPID PANEL
CHOL/HDL RATIO: 2.9 ratio (ref 0.0–4.4)
Cholesterol, Total: 189 mg/dL (ref 100–199)
HDL: 66 mg/dL (ref >39)
LDL Calculated: 104 mg/dL — ABNORMAL HIGH (ref 0–99)
TRIGLYCERIDES: 96 mg/dL (ref 0–149)
VLDL Cholesterol Cal: 19 mg/dL (ref 5–40)

## 2016-08-31 LAB — CBC
HEMATOCRIT: 42.4 % (ref 34.0–46.6)
Hemoglobin: 13.9 g/dL (ref 11.1–15.9)
MCH: 27.6 pg (ref 26.6–33.0)
MCHC: 32.8 g/dL (ref 31.5–35.7)
MCV: 84 fL (ref 79–97)
Platelets: 336 10*3/uL (ref 150–379)
RBC: 5.04 x10E6/uL (ref 3.77–5.28)
RDW: 13.3 % (ref 12.3–15.4)
WBC: 6.1 10*3/uL (ref 3.4–10.8)

## 2016-08-31 LAB — COMPREHENSIVE METABOLIC PANEL
ALBUMIN: 4.9 g/dL (ref 3.5–5.5)
ALK PHOS: 66 IU/L (ref 39–117)
ALT: 23 IU/L (ref 0–32)
AST: 18 IU/L (ref 0–40)
Albumin/Globulin Ratio: 2 (ref 1.2–2.2)
BUN/Creatinine Ratio: 13 (ref 9–23)
BUN: 10 mg/dL (ref 6–20)
Bilirubin Total: 0.2 mg/dL (ref 0.0–1.2)
CO2: 23 mmol/L (ref 18–29)
Calcium: 9.6 mg/dL (ref 8.7–10.2)
Chloride: 101 mmol/L (ref 96–106)
Creatinine, Ser: 0.79 mg/dL (ref 0.57–1.00)
GFR calc Af Amer: 118 mL/min/{1.73_m2} (ref >59)
GFR, EST NON AFRICAN AMERICAN: 102 mL/min/{1.73_m2} (ref >59)
GLOBULIN, TOTAL: 2.4 g/dL (ref 1.5–4.5)
Glucose: 83 mg/dL (ref 65–99)
Potassium: 4.6 mmol/L (ref 3.5–5.2)
SODIUM: 142 mmol/L (ref 134–144)
Total Protein: 7.3 g/dL (ref 6.0–8.5)

## 2016-08-31 LAB — TSH: TSH: 0.775 u[IU]/mL (ref 0.450–4.500)

## 2016-08-31 LAB — VITAMIN D 25 HYDROXY (VIT D DEFICIENCY, FRACTURES): VIT D 25 HYDROXY: 35.5 ng/mL (ref 30.0–100.0)

## 2017-11-07 ENCOUNTER — Telehealth: Payer: Self-pay | Admitting: Obstetrics

## 2017-11-28 ENCOUNTER — Encounter: Payer: Self-pay | Admitting: Obstetrics

## 2017-11-28 ENCOUNTER — Other Ambulatory Visit: Payer: Self-pay

## 2017-11-28 ENCOUNTER — Ambulatory Visit (INDEPENDENT_AMBULATORY_CARE_PROVIDER_SITE_OTHER): Payer: BC Managed Care – PPO | Admitting: Obstetrics

## 2017-11-28 VITALS — BP 123/74 | HR 73 | Ht 62.0 in | Wt 148.5 lb

## 2017-11-28 DIAGNOSIS — Z30431 Encounter for routine checking of intrauterine contraceptive device: Secondary | ICD-10-CM

## 2017-11-28 DIAGNOSIS — Z01419 Encounter for gynecological examination (general) (routine) without abnormal findings: Secondary | ICD-10-CM | POA: Diagnosis not present

## 2017-11-28 NOTE — Progress Notes (Signed)
Subjective:        Katelyn Marks is a 32 y.o. female here for a routine exam.  Current complaints: None.    Personal health questionnaire:  Is patient Katelyn Marks, have a family history of breast and/or ovarian cancer: no Is there a family history of uterine cancer diagnosed at age < 13, gastrointestinal cancer, urinary tract cancer, family member who is a Personnel officer syndrome-associated carrier: no Is the patient overweight and hypertensive, family history of diabetes, personal history of gestational diabetes, preeclampsia or PCOS: no Is patient over 104, have PCOS,  family history of premature CHD under age 38, diabetes, smoke, have hypertension or peripheral artery disease:  no At any time, has a partner hit, kicked or otherwise hurt or frightened you?: no Over the past 2 weeks, have you felt down, depressed or hopeless?: no Over the past 2 weeks, have you felt little interest or pleasure in doing things?:no   Gynecologic History Patient's last menstrual period was 11/10/2017 (exact date). Contraception: IUD Last Pap: 2016. Results were: normal Last mammogram: n/a. Results were: n/a  Obstetric History OB History  Gravida Para Term Preterm AB Living  1 1 1     1   SAB TAB Ectopic Multiple Live Births        0 1    # Outcome Date GA Lbr Len/2nd Weight Sex Delivery Anes PTL Lv  1 Term 01/15/15 [redacted]w[redacted]d 17:29 / 04:24 11 lb 2.8 oz (5.069 kg) F CS-LTranv EPI  LIV     Birth Comments: caput, 5.09 kg      Past Medical History:  Diagnosis Date  . Medical history non-contributory     Past Surgical History:  Procedure Laterality Date  . CESAREAN SECTION N/A 01/15/2015   Procedure: CESAREAN SECTION;  Surgeon: Brock Bad, MD;  Location: [facility];  Service: Obstetrics;  Laterality: N/A;  . WISDOM TOOTH EXTRACTION    . wisdome to      No current outpatient medications on file. Allergies  Allergen Reactions  . Ciprofloxacin Nausea And Vomiting    Social History   Tobacco  Use  . Smoking status: Never Smoker  . Smokeless tobacco: Never Used  Substance Use Topics  . Alcohol use: Yes    Alcohol/week: 0.0 oz    Comment: occ.    Family History  Problem Relation Age of Onset  . Diabetes Maternal Grandmother   . Diabetes Paternal Grandfather       Review of Systems  Constitutional: negative for fatigue and weight loss Respiratory: negative for cough and wheezing Cardiovascular: negative for chest pain, fatigue and palpitations Gastrointestinal: negative for abdominal pain and change in bowel habits Musculoskeletal:negative for myalgias Neurological: negative for gait problems and tremors Behavioral/Psych: negative for abusive relationship, depression Endocrine: negative for temperature intolerance    Genitourinary:negative for abnormal menstrual periods, genital lesions, hot flashes, sexual problems and vaginal discharge Integument/breast: negative for breast lump, breast tenderness, nipple discharge and skin lesion(s)    Objective:       BP 123/74   Pulse 73   Ht 5\' 2"  (1.575 m)   Wt 148 lb 8 oz (67.4 kg)   LMP 11/10/2017 (Exact Date)   BMI 27.16 kg/m  General:   alert  Skin:   no rash or abnormalities  Lungs:   clear to auscultation bilaterally  Heart:   regular rate and rhythm, S1, S2 normal, no murmur, click, rub or gallop  Breasts:   normal without suspicious masses, skin or nipple  changes or axillary nodes  Abdomen:  normal findings: no organomegaly, soft, non-tender and no hernia  Pelvis:  External genitalia: normal general appearance Urinary system: urethral meatus normal and bladder without fullness, nontender Vaginal: normal without tenderness, induration or masses Cervix: normal appearance Adnexa: normal bimanual exam Uterus: anteverted and non-tender, normal size   Lab Review Urine pregnancy test Labs reviewed yes Radiologic studies reviewed no  50% of 20 min visit spent on counseling and coordination of care.    Assessment:     1. Encounter for routine gynecological examination with Papanicolaou smear of cervix Rx: - Cytology - PAP - Cervicovaginal ancillary only  2. Encounter for routine checking of intrauterine contraceptive device (IUD) - pleased with ParaGuard IUD after ~ 3 years of use   Plan:    Education reviewed: calcium supplements, depression evaluation, low fat, low cholesterol diet, safe sex/STD prevention, self breast exams and weight bearing exercise. Contraception: IUD. Follow up in: 2 years.   No orders of the defined types were placed in this encounter.  No orders of the defined types were placed in this encounter.   Brock BadHARLES A. Bayard More MD

## 2017-11-28 NOTE — Progress Notes (Signed)
Presents for AEX/PAP 

## 2017-11-29 LAB — CERVICOVAGINAL ANCILLARY ONLY
Bacterial vaginitis: NEGATIVE
Candida vaginitis: NEGATIVE
Trichomonas: NEGATIVE

## 2017-11-30 LAB — CYTOLOGY - PAP
ADEQUACY: ABSENT
DIAGNOSIS: NEGATIVE

## 2017-12-24 ENCOUNTER — Telehealth: Payer: Self-pay | Admitting: *Deleted

## 2017-12-24 NOTE — Telephone Encounter (Signed)
ERROR

## 2018-11-27 NOTE — Telephone Encounter (Signed)
Error

## 2019-01-01 ENCOUNTER — Telehealth (INDEPENDENT_AMBULATORY_CARE_PROVIDER_SITE_OTHER): Payer: Self-pay | Admitting: Obstetrics

## 2019-01-01 ENCOUNTER — Other Ambulatory Visit: Payer: Self-pay

## 2019-01-01 DIAGNOSIS — N946 Dysmenorrhea, unspecified: Secondary | ICD-10-CM

## 2019-01-01 DIAGNOSIS — N939 Abnormal uterine and vaginal bleeding, unspecified: Secondary | ICD-10-CM

## 2019-01-01 DIAGNOSIS — Z30430 Encounter for insertion of intrauterine contraceptive device: Secondary | ICD-10-CM

## 2019-01-01 MED ORDER — IBUPROFEN 800 MG PO TABS: 800.0000 mg | ORAL_TABLET | Freq: Three times a day (TID) | ORAL | 5 refills | Status: AC | PRN

## 2019-01-01 NOTE — Progress Notes (Addendum)
   TELEHEALTH VIRTUAL GYNECOLOGY VISIT ENCOUNTER NOTE  I connected with Katelyn Marks on 01/01/19 at 10:00 AM EDT by telephone at home and verified that I am speaking with the correct person using two identifiers.   I discussed the limitations, risks, security and privacy concerns of performing an evaluation and management service by telephone and the availability of in person appointments. I also discussed with the patient that there may be a patient responsible charge related to this service. The patient expressed understanding and agreed to proceed.   History:  Katelyn Marks is a 33 y.o. [id number] female being evaluated today for IUD Surveillance. She complains of periods getting heavier and more painful over the past year.  She has had a ParaGuard IUD for the past 4 years.     Past Medical History:  Diagnosis Date  . Medical history non-contributory    Past Surgical History:  Procedure Laterality Date  . CESAREAN SECTION N/A 01/15/2015   Procedure: CESAREAN SECTION;  Surgeon: Brock Bad, MD;  Location: [facility];  Service: Obstetrics;  Laterality: N/A;  . WISDOM TOOTH EXTRACTION    . wisdome to     The following portions of the patient's history were reviewed and updated as appropriate: allergies, current medications, past family history, past medical history, past social history, past surgical history and problem list.   Health Maintenance:  Normal pap and negative HRHPV on 11-28-2017.    Review of Systems:  Pertinent items noted in HPI and remainder of comprehensive ROS otherwise negative.  Physical Exam:  Physical exam deferred due to nature of the encounter  Labs and Imaging No results found for this or any previous visit (from the past 336 hour(s)). No results found.    Assessment and Plan:     1. Encounter for surveillance of intrauterine contraceptive device (IUD)  2. Dysmenorrhea Rx: - ibuprofen (ADVIL,MOTRIN) 800 MG tablet; Take 1 tablet (800 mg total) by  mouth every 8 (eight) hours as needed.  Dispense: 30 tablet; Refill: 5  3. Abnormal uterine bleeding (AUB) - same as above  4. Follow for annual as scheduled or prn       I discussed the assessment and treatment plan with the patient. The patient was provided an opportunity to ask questions and all were answered. The patient agreed with the plan and demonstrated an understanding of the instructions.   The patient was advised to call back or seek an in-person evaluation/go to the ED if the symptoms worsen or if the condition fails to improve as anticipated.  I provided 10 minutes of non-face-to-face time during this encounter.   Coral Ceo, MD [facility], [facility] 01-01-2019

## 2019-01-01 NOTE — Progress Notes (Signed)
Pt states that her cycles have gotten heavier over the past year or so. Pt states husband is planning to have Vasectomy in future and she may want to have IUD removed. Pt made aware she will have telephone visit to discuss with Dr Clearance Coots.
